# Patient Record
Sex: Female | Born: 1935 | Race: White | Hispanic: No | Marital: Married | Smoking: Never smoker
Health system: Southern US, Community
[De-identification: ages and names within clinical notes are randomized; demographics above are authoritative.]

## PROBLEM LIST (undated history)

## (undated) DIAGNOSIS — Z923 Personal history of irradiation: Secondary | ICD-10-CM

## (undated) DIAGNOSIS — I499 Cardiac arrhythmia, unspecified: Secondary | ICD-10-CM

## (undated) DIAGNOSIS — C4490 Unspecified malignant neoplasm of skin, unspecified: Secondary | ICD-10-CM

## (undated) DIAGNOSIS — I10 Essential (primary) hypertension: Secondary | ICD-10-CM

## (undated) DIAGNOSIS — Z9221 Personal history of antineoplastic chemotherapy: Secondary | ICD-10-CM

## (undated) DIAGNOSIS — C8580 Other specified types of non-Hodgkin lymphoma, unspecified site: Secondary | ICD-10-CM

## (undated) DIAGNOSIS — T7840XA Allergy, unspecified, initial encounter: Secondary | ICD-10-CM

## (undated) DIAGNOSIS — K219 Gastro-esophageal reflux disease without esophagitis: Secondary | ICD-10-CM

## (undated) DIAGNOSIS — E785 Hyperlipidemia, unspecified: Secondary | ICD-10-CM

## (undated) DIAGNOSIS — C50919 Malignant neoplasm of unspecified site of unspecified female breast: Secondary | ICD-10-CM

## (undated) DIAGNOSIS — I4891 Unspecified atrial fibrillation: Secondary | ICD-10-CM

## (undated) DIAGNOSIS — H409 Unspecified glaucoma: Secondary | ICD-10-CM

## (undated) DIAGNOSIS — N189 Chronic kidney disease, unspecified: Secondary | ICD-10-CM

## (undated) DIAGNOSIS — M1990 Unspecified osteoarthritis, unspecified site: Secondary | ICD-10-CM

## (undated) HISTORY — DX: Hyperlipidemia, unspecified: E78.5

## (undated) HISTORY — DX: Allergy, unspecified, initial encounter: T78.40XA

## (undated) HISTORY — DX: Unspecified osteoarthritis, unspecified site: M19.90

## (undated) HISTORY — DX: Other specified types of non-hodgkin lymphoma, unspecified site: C85.80

## (undated) HISTORY — DX: Unspecified malignant neoplasm of skin, unspecified: C44.90

## (undated) HISTORY — PX: BLADDER SUSPENSION: SHX72

## (undated) HISTORY — PX: OTHER SURGICAL HISTORY: SHX169

## (undated) HISTORY — PX: JOINT REPLACEMENT: SHX530

## (undated) HISTORY — PX: CHOLECYSTECTOMY: SHX55

## (undated) HISTORY — DX: Chronic kidney disease, unspecified: N18.9

## (undated) HISTORY — DX: Unspecified glaucoma: H40.9

## (undated) HISTORY — PX: ABDOMINAL HYSTERECTOMY: SHX81

## (undated) HISTORY — PX: BREAST SURGERY: SHX581

## (undated) HISTORY — PX: ROTATOR CUFF REPAIR: SHX139

## (undated) HISTORY — DX: Cardiac arrhythmia, unspecified: I49.9

## (undated) HISTORY — PX: KNEE SURGERY: SHX244

---

## 2004-11-24 DIAGNOSIS — C50919 Malignant neoplasm of unspecified site of unspecified female breast: Secondary | ICD-10-CM

## 2004-11-24 HISTORY — PX: MASTECTOMY: SHX3

## 2004-11-24 HISTORY — DX: Malignant neoplasm of unspecified site of unspecified female breast: C50.919

## 2006-09-08 ENCOUNTER — Ambulatory Visit: Payer: Self-pay | Admitting: Family Medicine

## 2008-01-25 ENCOUNTER — Ambulatory Visit: Payer: Self-pay | Admitting: Gastroenterology

## 2009-02-05 ENCOUNTER — Ambulatory Visit: Payer: Self-pay | Admitting: Family Medicine

## 2009-02-23 ENCOUNTER — Ambulatory Visit: Payer: Self-pay | Admitting: Family Medicine

## 2009-09-21 ENCOUNTER — Ambulatory Visit: Payer: Self-pay | Admitting: Family Medicine

## 2010-06-23 ENCOUNTER — Ambulatory Visit: Payer: Self-pay | Admitting: Internal Medicine

## 2011-07-24 ENCOUNTER — Encounter: Payer: Self-pay | Admitting: Family Medicine

## 2011-07-26 ENCOUNTER — Encounter: Payer: Self-pay | Admitting: Family Medicine

## 2011-08-25 ENCOUNTER — Encounter: Payer: Self-pay | Admitting: Family Medicine

## 2011-09-25 ENCOUNTER — Encounter: Payer: Self-pay | Admitting: Family Medicine

## 2011-10-25 ENCOUNTER — Encounter: Payer: Self-pay | Admitting: Family Medicine

## 2011-11-25 ENCOUNTER — Encounter: Payer: Self-pay | Admitting: Family Medicine

## 2013-07-02 ENCOUNTER — Observation Stay: Payer: Self-pay | Admitting: Surgery

## 2013-07-02 ENCOUNTER — Ambulatory Visit: Payer: Self-pay | Admitting: Family Medicine

## 2013-07-02 LAB — COMPREHENSIVE METABOLIC PANEL
Alkaline Phosphatase: 105 U/L (ref 50–136)
Anion Gap: 10 (ref 7–16)
BUN: 34 mg/dL — ABNORMAL HIGH (ref 7–18)
Bilirubin,Total: 0.5 mg/dL (ref 0.2–1.0)
Calcium, Total: 9.8 mg/dL (ref 8.5–10.1)
Co2: 26 mmol/L (ref 21–32)
Creatinine: 2.37 mg/dL — ABNORMAL HIGH (ref 0.60–1.30)
EGFR (African American): 22 — ABNORMAL LOW
EGFR (Non-African Amer.): 19 — ABNORMAL LOW
Osmolality: 276 (ref 275–301)
Potassium: 4.6 mmol/L (ref 3.5–5.1)
SGOT(AST): 30 U/L (ref 15–37)

## 2013-07-02 LAB — URINALYSIS, COMPLETE
Blood: NEGATIVE
Glucose,UR: NEGATIVE mg/dL (ref 0–75)
Granular Cast: 2
Leukocyte Esterase: NEGATIVE
Nitrite: NEGATIVE
Ph: 5 (ref 4.5–8.0)
Protein: 100
RBC,UR: 3 /HPF (ref 0–5)
Specific Gravity: 1.02 (ref 1.003–1.030)
WBC UR: 21 /HPF (ref 0–5)

## 2013-07-02 LAB — CBC WITH DIFFERENTIAL/PLATELET
Basophil #: 0.1 10*3/uL (ref 0.0–0.1)
Basophil %: 0.5 %
Eosinophil #: 0.1 10*3/uL (ref 0.0–0.7)
Eosinophil %: 0.7 %
HCT: 36.3 % (ref 35.0–47.0)
HGB: 11.8 g/dL — ABNORMAL LOW (ref 12.0–16.0)
Lymphocyte #: 3 10*3/uL (ref 1.0–3.6)
Lymphocyte %: 17.1 %
MCHC: 32.6 g/dL (ref 32.0–36.0)
MCV: 89 fL (ref 80–100)
Monocyte %: 9 %
Neutrophil #: 12.6 10*3/uL — ABNORMAL HIGH (ref 1.4–6.5)
Neutrophil %: 72.7 %
RBC: 4.06 10*6/uL (ref 3.80–5.20)
WBC: 17.3 10*3/uL — ABNORMAL HIGH (ref 3.6–11.0)

## 2013-07-02 LAB — LIPASE, BLOOD: Lipase: 115 U/L (ref 73–393)

## 2013-07-03 LAB — CBC WITH DIFFERENTIAL/PLATELET
Basophil %: 0.4 %
Eosinophil %: 1.4 %
HGB: 10.2 g/dL — ABNORMAL LOW (ref 12.0–16.0)
MCHC: 34.1 g/dL (ref 32.0–36.0)
Monocyte #: 1.2 x10 3/mm — ABNORMAL HIGH (ref 0.2–0.9)
Neutrophil #: 10.1 10*3/uL — ABNORMAL HIGH (ref 1.4–6.5)
Neutrophil %: 67.5 %
RBC: 3.34 10*6/uL — ABNORMAL LOW (ref 3.80–5.20)
WBC: 14.9 10*3/uL — ABNORMAL HIGH (ref 3.6–11.0)

## 2013-07-03 LAB — BASIC METABOLIC PANEL
Anion Gap: 4 — ABNORMAL LOW (ref 7–16)
BUN: 32 mg/dL — ABNORMAL HIGH (ref 7–18)
Calcium, Total: 8.9 mg/dL (ref 8.5–10.1)
Creatinine: 1.24 mg/dL (ref 0.60–1.30)
Glucose: 123 mg/dL — ABNORMAL HIGH (ref 65–99)
Osmolality: 275 (ref 275–301)
Potassium: 4.7 mmol/L (ref 3.5–5.1)

## 2013-07-04 LAB — BASIC METABOLIC PANEL
Anion Gap: 6 — ABNORMAL LOW (ref 7–16)
BUN: 17 mg/dL (ref 7–18)
Calcium, Total: 8.7 mg/dL (ref 8.5–10.1)
Chloride: 106 mmol/L (ref 98–107)
Co2: 24 mmol/L (ref 21–32)
Glucose: 125 mg/dL — ABNORMAL HIGH (ref 65–99)
Osmolality: 275 (ref 275–301)
Potassium: 5.1 mmol/L (ref 3.5–5.1)

## 2013-07-04 LAB — CBC WITH DIFFERENTIAL/PLATELET
Basophil %: 0.2 %
Eosinophil #: 0 10*3/uL (ref 0.0–0.7)
HGB: 9.4 g/dL — ABNORMAL LOW (ref 12.0–16.0)
Lymphocyte #: 2.4 10*3/uL (ref 1.0–3.6)
Lymphocyte %: 18.7 %
MCHC: 34.3 g/dL (ref 32.0–36.0)
Monocyte #: 1 x10 3/mm — ABNORMAL HIGH (ref 0.2–0.9)
Monocyte %: 7.5 %
Neutrophil #: 9.5 10*3/uL — ABNORMAL HIGH (ref 1.4–6.5)
RDW: 13.6 % (ref 11.5–14.5)

## 2013-07-11 LAB — PATHOLOGY REPORT

## 2013-09-27 ENCOUNTER — Ambulatory Visit: Payer: Self-pay

## 2013-10-19 ENCOUNTER — Ambulatory Visit: Payer: Self-pay | Admitting: Physician Assistant

## 2013-10-19 LAB — URINALYSIS, COMPLETE
Bacteria: NEGATIVE
Bilirubin,UR: NEGATIVE
Glucose,UR: NEGATIVE mg/dL (ref 0–75)
Nitrite: NEGATIVE
Ph: 7.5 (ref 4.5–8.0)
Protein: NEGATIVE
Specific Gravity: 1.005 (ref 1.003–1.030)

## 2013-10-21 LAB — URINE CULTURE

## 2013-12-13 ENCOUNTER — Ambulatory Visit: Payer: Self-pay | Admitting: Gastroenterology

## 2013-12-14 ENCOUNTER — Ambulatory Visit: Payer: Self-pay | Admitting: Gastroenterology

## 2013-12-22 ENCOUNTER — Ambulatory Visit: Payer: Self-pay | Admitting: Internal Medicine

## 2013-12-29 ENCOUNTER — Ambulatory Visit: Payer: Self-pay | Admitting: Internal Medicine

## 2014-06-12 ENCOUNTER — Ambulatory Visit: Payer: Self-pay

## 2014-06-12 LAB — URINALYSIS, COMPLETE
Bilirubin,UR: NEGATIVE
Glucose,UR: NEGATIVE mg/dL (ref 0–75)
Ketone: NEGATIVE
NITRITE: NEGATIVE
Ph: 6 (ref 4.5–8.0)
Protein: NEGATIVE
SPECIFIC GRAVITY: 1.015 (ref 1.003–1.030)

## 2014-06-14 LAB — URINE CULTURE

## 2014-12-26 ENCOUNTER — Ambulatory Visit: Payer: Self-pay | Admitting: Internal Medicine

## 2015-03-16 NOTE — Discharge Summary (Signed)
PATIENT NAME:  Sandy Hickman, Sandy Hickman MR#:  [id number] DATE OF BIRTH:  1934/08/05  DATE OF ADMISSION:  07/02/2013 DATE OF DISCHARGE:  07/05/2013  BRIEF HISTORY: Sandy Hickman is a 81 year old woman admitted through the Emergency Room with abdominal pain, nausea and vomiting. She had similar episodes in the past which have been treated as diverticulitis. She presented to the acute care center in [facility] on the morning of admission where laboratory values noted a creatinine of 2.37 and elevated white blood cell count 17,000. CT scan revealed multiple gallstones, probably a large impacted cystic duct stone and pericholecystic fluid. She was admitted to the hospital, placed on IV antibiotics and rehydration. Her creatinine came down the following day to 1.2. She continued to have significant abdominal pain. We thought she had acute cholecystitis. She was taken to surgery on the morning of 07/03/2013. She underwent a laparoscopic cholecystectomy. The patient had significant acute cholecystitis which improved over the next 48 hours. She was discharged home on the 13th to be followed in the office in 7 to 10 days' time. Bathing, activity and driving instructions were given to the patient. She was discharged home on aspirin 81 mg p.o. daily, calcium carbonate 600 mg once a day, citalopram 10 mg once a day, enalapril 5 mg b.i.d., gemfibrozil 600 mg once a day, Klor-Con 20 mg once a day, Ambien 10 mg once a day, omeprazole 20 mg once a day, anastrozole 1 mg once a day and Vicodin 5/325 every 4 hours p.r.n. pain.   FINAL DISCHARGE DIAGNOSIS:  Acute cholecystitis.  SURGERY: Laparoscopic cholecystectomy. ____________________________ Rodena Goldmann III, MD rle:sb D: 07/11/2013 22:35:16 ET T: 07/12/2013 08:52:31 ET JOB#: [id number]  cc: Micheline Maze, MD, <Dictator> Valetta Close, MD Rodena Goldmann MD ELECTRONICALLY SIGNED 07/12/2013 19:26

## 2015-03-16 NOTE — Op Note (Signed)
PATIENT NAME:  Sandy Hickman, Sandy Hickman MR#:  [id number] DATE OF BIRTH:  1934/09/01  DATE OF PROCEDURE:  07/03/2013  PREOPERATIVE DIAGNOSIS: Acute cholecystitis.   POSTOPERATIVE DIAGNOSIS: Acute cholecystitis.   OPERATION: Laparoscopic cholecystectomy.   SURGEON: Micheline Maze, M.D.   ANESTHESIA: General.   OPERATIVE PROCEDURE: With the patient in the supine position after induction of appropriate general anesthesia, the patient's abdomen was prepped with ChloraPrep and draped with sterile towels. The patient was placed in the head down, feet up position. A small infraumbilical incision was made in the standard fashion and carried down bluntly through the subcutaneous tissue. The Veress needle was used to cannulate the peritoneal cavity. CO2 was insufflated to appropriate pressure measurements. When approximately 2.5 liters of CO2 was instilled, the Veress needle was withdrawn. An 11 mm [employer] port was inserted into the peritoneal cavity. Intraperitoneal position was confirmed. CO2 was reinsufflated. The patient was placed in the head up, feet down position and rotated slightly to the left side. A subxiphoid transverse incision was made, and an 11 mm port was inserted under direct vision. Two lateral ports 5 mm in size were inserted under direct vision. The gallbladder was significantly distended and erythematous with obvious changes of edema and acute cholecystitis. The gallbladder was drained of approximately 50 mL of purulent-appearing bile. There was almost no bile staining in the material. The gallbladder was grasped, retracted superiorly and laterally, exposing the hepatoduodenal ligament. There appeared to be a very large, several centimeter ductal stone at the cystic and common duct junction. The gallbladder was dissected around this area, exposing the cystic artery and cystic duct. The gallstone was moved back into the liver with manipulating it through the grasper and the cystic duct doubly  clipped and divided. The cystic artery was doubly clipped and divided. The gallbladder was then dissected free from its bed to the liver using cautery apparatus. The gallbladder was captured in an Endo Catch apparatus and removed through the umbilicus. The umbilical incision had to be enlarged to accommodate the very large gallbladder. There had been significant bleeding in the bed of the liver because of the acute inflammatory changes. The bed was re-examined and several small bleeding points coagulated. The area was irrigated with warm saline solution. A 19-French Blake drain was placed through one of the other ports and brought out through the lower lateral port with the drain being placed in the bed of the liver. It was secured with 3-0 nylon. The upper abdominal incision was closed under direct vision using the suture passer and 0 Vicryl. The midline fascia was closed with interrupted figure-of-eight sutures of 0 Vicryl. Skin was closed with 5-0 nylon. The area was infiltrated with 0.25% Marcaine for postoperative pain control. Sterile dressings were applied. The patient was returned to the recovery room, having tolerated the procedure well. Sponge, instrument and needle counts were correct x2 in the operating room.   ____________________________ Rodena Goldmann III, MD rle:gb D: 07/03/2013 13:15:40 ET T: 07/03/2013 21:28:04 ET JOB#: [id number]  cc: Rodena Goldmann III, MD, <Dictator> Valetta Close, MD Rodena Goldmann MD ELECTRONICALLY SIGNED 07/11/2013 22:33

## 2015-03-16 NOTE — H&P (Signed)
PATIENT NAME:  Sandy Hickman, Sandy Hickman MR#:  [id number] DATE OF BIRTH:  [birth year]  DATE OF ADMISSION:  07/02/2013  PRIMARY CARE PHYSICIAN:  Dr. Bernie Covey  ADMITTING PHYSICIAN:  Dr. Pat Patrick  CHIEF COMPLAINT:  Abdominal pain, nausea and vomiting.   BRIEF HISTORY: Avalee Castrellon is a 81 year old woman with a several-day history of abdominal pain, primarily midepigastric, midabdominal and right upper quadrant pain. She has had several episodes of diverticulitis treated with antibiotics. She felt the symptoms were very similar and likely related to diverticular disease. She has had a colonoscopy demonstrating diverticulosis. She went to the primary care physician she normally uses in the middle of the week, was placed on antibiotics, but did not begin to improve on antibiotics. She continued to have nausea and vomiting, and increasing abdominal pain. The pain was worse this morning, so she presented to the [place] [facility], where laboratory values revealed some markedly abnormal results. She had a white blood cell count of 17,000, creatinine of 2.37, normal liver function studies. She was referred to the hospital for further evaluation. CT scan was performed in the Emergency Room, which demonstrated multiple gallstones, a probable  large impacted cystic duct stone, and pericholecystic fluid consistent with acute cholecystitis. She does not have any biliary tract history. She denies history of hepatitis, yellow jaundice, pancreatitis, peptic ulcer disease, or previous diagnosis of gallbladder disease. The only previous abdominal surgery has been a vaginal hysterectomy. She has had a partial mastectomy, radiation and chemotherapy for breast cancer in 2006. That series of treatments was undertaken at [facility]. She has history of cardiac irregularity, currently not on any particular treatment. She has history of hypertension. She has no history of myocardial infarction, cardiac catheterization, diabetes  or thyroid disease. She is regularly followed by Dr. Clemmie Krill.   CURRENT MEDICATIONS:  Include Augmentin 875 b.i.d., Arimidex 1 mg once a day, aspirin 81 mg once a day, citalopram 10 mg once a day, enalapril 5 mg b.i.d., gemfibrozil 600 mg once a day, potassium 20 mg once a day, omeprazole 20 mg once a day, and Ambien 10 mg at bedtime p.r.n.  ALLERGIES:  She is allergic to NEOSPORIN and SULFA DRUGS, both of which cause dermatitis and itching.   SOCIAL HISTORY:  She is not a cigarette smoker. She does not drink alcohol regularly. She lives with her husband.   REVIEW OF SYSTEMS:  Unremarkable.   PHYSICAL EXAMINATION: GENERAL: She is an alert, pleasant woman in moderate distress from pain. She is feeling better since presenting to the Emergency Room.  VITAL SIGNS:  Her blood pressure is 111/45, pulse 76 and regular. Pain scale is now 4.  HEENT:  Exam reveals no scleral icterus, no pupillary abnormalities, no facial deformities.  NECK:  Supple, nontender, with no adenopathy and a normal midline trachea.  CHEST:  Clear, with no adventitious sounds. She has normal pulmonary excursion.  CARDIAC:  Exam reveals no murmurs or gallops. She seems to be in a normal sinus rhythm. I do not hear any dysrhythmia on examination.  ABDOMEN:  Exam reveals some mild right upper quadrant and midepigastric tenderness. I cannot palpate her gallbladder. She has hypoactive but present bowel sounds. CT scan shows she has  a right inguinal hernia, but I cannot demonstrate it on exam.  EXTREMITIES:  Lower extremity exam reveals full range of motion, no deformities. Good distal pulses.  PSYCHIATRIC:  Exam reveals normal orientation, normal affect.   DATA:  I have independently reviewed her CT scan.  She does appear to have multiple, very large gallstones with gallbladder wall thickening and a possible impacted cystic duct stone. The scan is noncontrasted, so I do not see any other significant abnormalities.   PLAN:  Will plan  on admitting her to the hospital. We will rehydrate her, start her on antibiotics. I think surgery is the appropriate therapy in this situation, but because of her creatinine, we will rehydrate her and see how she responds to that therapy before deciding on the timing of surgery.  This plan has been discussed with the patient in detail, and she is in agreement. Her family was present for the interview.     ____________________________ Rodena Goldmann III, MD rle:mr D: 07/02/2013 17:26:26 ET T: 07/02/2013 19:03:33 ET JOB#: [id number]  cc: Micheline Maze, MD, <Dictator> Valetta Close, MD  Rodena Goldmann MD ELECTRONICALLY SIGNED 07/03/2013 14:01

## 2015-03-18 ENCOUNTER — Ambulatory Visit
Admit: 2015-03-18 | Disposition: A | Discharge status: Home / Self Care | Payer: Self-pay | Attending: Family Medicine | Admitting: Family Medicine

## 2015-08-02 ENCOUNTER — Other Ambulatory Visit: Payer: Self-pay | Admitting: Internal Medicine

## 2015-08-02 DIAGNOSIS — R1032 Left lower quadrant pain: Principal | ICD-10-CM

## 2015-08-02 DIAGNOSIS — D72829 Elevated white blood cell count, unspecified: Secondary | ICD-10-CM

## 2015-08-02 DIAGNOSIS — R1031 Right lower quadrant pain: Secondary | ICD-10-CM

## 2015-08-03 ENCOUNTER — Ambulatory Visit: Payer: Self-pay

## 2015-08-06 ENCOUNTER — Ambulatory Visit: Payer: PPO

## 2015-08-06 ENCOUNTER — Ambulatory Visit: Payer: Self-pay

## 2015-08-10 ENCOUNTER — Ambulatory Visit
Admission: RE | Admit: 2015-08-10 | Discharge: 2015-08-10 | Disposition: A | Discharge status: Home / Self Care | Payer: PPO | Source: Ambulatory Visit | Attending: Internal Medicine | Admitting: Internal Medicine

## 2015-08-10 DIAGNOSIS — R1031 Right lower quadrant pain: Secondary | ICD-10-CM | POA: Insufficient documentation

## 2015-08-10 DIAGNOSIS — K760 Fatty (change of) liver, not elsewhere classified: Secondary | ICD-10-CM | POA: Diagnosis not present

## 2015-08-10 DIAGNOSIS — K5730 Diverticulosis of large intestine without perforation or abscess without bleeding: Secondary | ICD-10-CM | POA: Diagnosis not present

## 2015-08-10 DIAGNOSIS — R1032 Left lower quadrant pain: Secondary | ICD-10-CM | POA: Diagnosis present

## 2015-08-10 DIAGNOSIS — D72829 Elevated white blood cell count, unspecified: Secondary | ICD-10-CM

## 2015-08-10 MED ORDER — IOHEXOL 300 MG/ML  SOLN
125.0000 mL | Freq: Once | INTRAMUSCULAR | Status: AC | PRN
  Administered 2015-08-10: 125 mL via INTRAVENOUS

## 2015-09-20 ENCOUNTER — Other Ambulatory Visit: Payer: Self-pay | Admitting: Internal Medicine

## 2015-09-20 DIAGNOSIS — Z1231 Encounter for screening mammogram for malignant neoplasm of breast: Secondary | ICD-10-CM

## 2015-09-20 DIAGNOSIS — C50911 Malignant neoplasm of unspecified site of right female breast: Secondary | ICD-10-CM

## 2015-09-30 ENCOUNTER — Ambulatory Visit
Admission: EM | Admit: 2015-09-30 | Discharge: 2015-09-30 | Disposition: A | Discharge status: Home / Self Care | Payer: PPO | Attending: Internal Medicine | Admitting: Internal Medicine

## 2015-09-30 ENCOUNTER — Encounter: Payer: Self-pay | Admitting: *Deleted

## 2015-09-30 DIAGNOSIS — N390 Urinary tract infection, site not specified: Secondary | ICD-10-CM

## 2015-09-30 DIAGNOSIS — R319 Hematuria, unspecified: Secondary | ICD-10-CM

## 2015-09-30 HISTORY — DX: Essential (primary) hypertension: I10

## 2015-09-30 HISTORY — DX: Gastro-esophageal reflux disease without esophagitis: K21.9

## 2015-09-30 LAB — URINALYSIS COMPLETE WITH MICROSCOPIC (ARMC ONLY)

## 2015-09-30 MED ORDER — CIPROFLOXACIN HCL 250 MG PO TABS: 250.0000 mg | ORAL_TABLET | Freq: Two times a day (BID) | ORAL | Status: AC

## 2015-09-30 MED ORDER — NITROFURANTOIN MONOHYD MACRO 100 MG PO CAPS: 100.0000 mg | ORAL_CAPSULE | Freq: Two times a day (BID) | ORAL | Status: DC

## 2015-09-30 NOTE — ED Provider Notes (Signed)
CSN: [id number]     Arrival date & time 09/30/15  1325 History   First MD Initiated Contact with Patient 09/30/15 1519     Chief Complaint  Patient presents with  . Urinary Tract Infection   HPI Patient is a 81 year old lady with past medical history notable for acid reflux and hypertension. She presents today with onset of suprapubic discomfort and dysuria yesterday, discomfort radiates across the low back. She started with achiness all over a couple of days prior to that. No fever, no nausea/vomiting. No diarrhea, no bowel change. No unusual vaginal bleeding or discharge. She had a urinary tract infection several months ago, that responded promptly to [employer].  Past Medical History  Diagnosis Date  . Cancer (HCC)     breast  . Hypertension   . GERD (gastroesophageal reflux disease)    Past Surgical History  Procedure Laterality Date  . Cholecystectomy    . Abdominal hysterectomy    . Breast surgery    . Joint replacement     History reviewed. No pertinent family history. Social History  Substance Use Topics  . Smoking status: Never Smoker   . Smokeless tobacco: Never Used  . Alcohol Use: No    Review of Systems  All other systems reviewed and are negative.   Allergies  Neosporin and Sulfa antibiotics  Home Medications   Prior to Admission medications   Medication Sig Start Date End Date Taking? Authorizing Provider  anastrozole (ARIMIDEX) 1 MG tablet Take 1 mg by mouth daily.   Yes Historical Provider, MD  aspirin 81 MG tablet Take 81 mg by mouth daily.   Yes Historical Provider, MD  citalopram (CELEXA) 10 MG tablet Take 10 mg by mouth daily.   Yes Historical Provider, MD  gemfibrozil (LOPID) 600 MG tablet Take 600 mg by mouth daily.   Yes Historical Provider, MD  latanoprost (XALATAN) 0.005 % ophthalmic solution Place 1 drop into both eyes at bedtime.   Yes Historical Provider, MD  losartan (COZAAR) 50 MG tablet Take 50 mg by mouth daily.   Yes Historical Provider,  MD  Magnesium 500 MG CAPS Take 1 each by mouth daily.   Yes Historical Provider, MD  Multiple Vitamins-Minerals (CENTRUM SILVER PO) Take 1 each by mouth daily.   Yes Historical Provider, MD  Omega-3 Fatty Acids (FISH OIL) 1200 MG CPDR Take 1 each by mouth daily.   Yes Historical Provider, MD  omeprazole (PRILOSEC) 20 MG capsule Take 20 mg by mouth daily.   Yes Historical Provider, MD  polycarbophil (FIBERCON) 625 MG tablet Take 1,250 mg by mouth daily.   Yes Historical Provider, MD  Timolol Maleate PF 0.25 % SOLN Apply 1 drop to eye daily.   Yes Historical Provider, MD    BP 165/83 mmHg  Pulse 73  Temp(Src) 98 F (36.7 C) (Oral)  Resp 20  Ht 5' 6.5" (1.689 m)  Wt 150 lb (68.04 kg)  BMI 23.85 kg/m2  SpO2 98%   Physical Exam  Constitutional: She is oriented to person, place, and time. No distress.  Alert, nicely groomed  HENT:  Head: Atraumatic.  Eyes:  Conjugate gaze, no eye redness/drainage  Neck: Neck supple.  Cardiovascular: Normal rate.   Pulmonary/Chest: No respiratory distress.  Abdominal: She exhibits no distension.  Musculoskeletal: Normal range of motion.  No leg swelling  Neurological: She is alert and oriented to person, place, and time.  Skin: Skin is warm and dry.  Pink. No cyanosis  Nursing note and vitals  reviewed.   ED Course  Procedures (including critical care time)  Labs Review Labs Reviewed  URINALYSIS COMPLETEWITH MICROSCOPIC ([facility] ONLY) - Abnormal; Notable for the following:    Color, Urine ORANGE (*)    APPearance CLOUDY (*)    Glucose, UA   (*)    Value: TEST NOT REPORTED DUE TO COLOR INTERFERENCE OF URINE PIGMENT   Bilirubin Urine   (*)    Value: TEST NOT REPORTED DUE TO COLOR INTERFERENCE OF URINE PIGMENT   Ketones, ur   (*)    Value: TEST NOT REPORTED DUE TO COLOR INTERFERENCE OF URINE PIGMENT   Hgb urine dipstick   (*)    Value: TEST NOT REPORTED DUE TO COLOR INTERFERENCE OF URINE PIGMENT   Protein, ur   (*)    Value: TEST NOT  REPORTED DUE TO COLOR INTERFERENCE OF URINE PIGMENT   Nitrite   (*)    Value: TEST NOT REPORTED DUE TO COLOR INTERFERENCE OF URINE PIGMENT   Leukocytes, UA   (*)    Value: TEST NOT REPORTED DUE TO COLOR INTERFERENCE OF URINE PIGMENT   Bacteria, UA MANY (*)    Squamous Epithelial / LPF 0-5 (*)    All other components within normal limits  URINE CULTURE    Urine culture pending.    MDM   1. Urinary tract infection with hematuria, site unspecified    Discharge Medication List as of 09/30/2015  3:31 PM    START taking these medications   Details  nitrofurantoin, macrocrystal-monohydrate, (MACROBID) 100 MG capsule Take 1 capsule (100 mg total) by mouth 2 (two) times daily., Starting 09/30/2015, Until Discontinued, Normal       Telephone call from pharmacy after pt discharge:  Insurance does not cover macrobid. Cipro 250mg  bid x 5d #10, no refill, substituted.    Sherlene Shams, MD 09/30/15 [phone]

## 2015-09-30 NOTE — Discharge Instructions (Signed)
Symptoms and urinalysis today suggest a urinary tract infection is causing your symptoms. A prescription for macrobid (nitrofurantoin, antibiotic) was sent to the [facility] in [place]. A urine culture is pending; result should be available in the next 2-3 days. Recheck or followup with pcp/Dr Raechel Ache if not starting to improve in a couple days.

## 2015-09-30 NOTE — ED Notes (Signed)
Patient started having abdominal pain and back pain with urinary burning yesterday. Patient reports having UTI in the past but not frequently.

## 2015-10-02 LAB — URINE CULTURE: Culture: >=100000

## 2015-10-02 NOTE — ED Notes (Signed)
Final report of urine C&S shows klebsiella, sensitive to Rx provided day of visit

## 2016-01-02 ENCOUNTER — Ambulatory Visit
Admission: RE | Admit: 2016-01-02 | Discharge: 2016-01-02 | Disposition: A | Discharge status: Home / Self Care | Payer: PPO | Source: Ambulatory Visit | Attending: Internal Medicine | Admitting: Internal Medicine

## 2016-01-02 DIAGNOSIS — C50911 Malignant neoplasm of unspecified site of right female breast: Secondary | ICD-10-CM

## 2016-01-02 DIAGNOSIS — Z853 Personal history of malignant neoplasm of breast: Secondary | ICD-10-CM | POA: Diagnosis present

## 2016-01-02 DIAGNOSIS — Z1231 Encounter for screening mammogram for malignant neoplasm of breast: Secondary | ICD-10-CM | POA: Diagnosis present

## 2016-01-02 HISTORY — DX: Malignant neoplasm of unspecified site of unspecified female breast: C50.919

## 2016-10-30 ENCOUNTER — Ambulatory Visit
Admission: EM | Admit: 2016-10-30 | Discharge: 2016-10-30 | Disposition: A | Discharge status: Home / Self Care | Payer: PPO | Attending: Family Medicine | Admitting: Family Medicine

## 2016-10-30 DIAGNOSIS — R300 Dysuria: Secondary | ICD-10-CM

## 2016-10-30 DIAGNOSIS — N390 Urinary tract infection, site not specified: Secondary | ICD-10-CM

## 2016-10-30 LAB — URINALYSIS, COMPLETE (UACMP) WITH MICROSCOPIC

## 2016-10-30 MED ORDER — ONDANSETRON HCL 4 MG PO TABS: 4.0000 mg | ORAL_TABLET | Freq: Four times a day (QID) | ORAL | 0 refills | Status: DC

## 2016-10-30 MED ORDER — CEFUROXIME AXETIL 250 MG PO TABS
250.0000 mg | ORAL_TABLET | Freq: Two times a day (BID) | ORAL | 0 refills | Status: DC
Start: 2016-10-30 — End: 2017-01-18

## 2016-10-30 NOTE — ED Provider Notes (Signed)
CSN: [id number]     Arrival date & time 10/30/16  1137 History   None    Chief Complaint  Patient presents with  . Urinary Tract Infection   (Consider location/radiation/quality/duration/timing/severity/associated sxs/prior Treatment) Patient c/o dysuria and bladder discomfort this am.  She states she has a lot urinary frequency.  She was nauseated and weak last night.  She c/o discomfort in her bladder radiating to her thighs bilateral.  She is seen by Dr. Raechel Ache who is her PCP.  She denies nausea at present. Patient has started taking AZO which is helping some she states.   The history is provided by the patient.  Urinary Tract Infection  Pain quality:  Aching Pain severity:  Moderate Onset quality:  Sudden Duration:  1 day Timing:  Constant Progression:  Unchanged Chronicity:  New Recent urinary tract infections: no   Relieved by:  Phenazopyridine (AZO) Worsened by:  Nothing Urinary symptoms: frequent urination   Associated symptoms: abdominal pain and nausea     Past Medical History:  Diagnosis Date  . Breast cancer ([place]) 2006   rt mastectomy  . Cancer (HCC)    breast  . GERD (gastroesophageal reflux disease)   . Hypertension    Past Surgical History:  Procedure Laterality Date  . ABDOMINAL HYSTERECTOMY    . BREAST SURGERY    . CHOLECYSTECTOMY    . JOINT REPLACEMENT    . MASTECTOMY Right 2006   rt mast /rad/chemo   Family History  Problem Relation Age of Onset  . Breast cancer Daughter 64   Social History  Substance Use Topics  . Smoking status: Never Smoker  . Smokeless tobacco: Never Used  . Alcohol use No   OB History    No data available     Review of Systems  Constitutional: Positive for fatigue.  Eyes: Negative.   Respiratory: Negative.   Cardiovascular: Negative.   Gastrointestinal: Positive for abdominal pain and nausea.  Endocrine: Negative.   Genitourinary: Positive for dysuria.  Musculoskeletal: Negative.   Skin: Negative.    Allergic/Immunologic: Negative.   Neurological: Negative.   Hematological: Negative.   Psychiatric/Behavioral: Negative.     Allergies  Neosporin [neomycin-bacitracin zn-polymyx] and Sulfa antibiotics  Home Medications   Prior to Admission medications   Medication Sig Start Date End Date Taking? Authorizing Provider  anastrozole (ARIMIDEX) 1 MG tablet Take 1 mg by mouth daily.   Yes Historical Provider, MD  aspirin 81 MG tablet Take 81 mg by mouth daily.   Yes Historical Provider, MD  citalopram (CELEXA) 10 MG tablet Take 10 mg by mouth daily.   Yes Historical Provider, MD  gemfibrozil (LOPID) 600 MG tablet Take 600 mg by mouth daily.   Yes Historical Provider, MD  latanoprost (XALATAN) 0.005 % ophthalmic solution Place 1 drop into both eyes at bedtime.   Yes Historical Provider, MD  losartan (COZAAR) 50 MG tablet Take 100 mg by mouth daily.    Yes Historical Provider, MD  Magnesium 500 MG CAPS Take 1 each by mouth daily.   Yes Historical Provider, MD  Multiple Vitamins-Minerals (CENTRUM SILVER PO) Take 1 each by mouth daily.   Yes Historical Provider, MD  Omega-3 Fatty Acids (FISH OIL) 1200 MG CPDR Take 1 each by mouth daily.   Yes Historical Provider, MD  omeprazole (PRILOSEC) 20 MG capsule Take 20 mg by mouth daily.   Yes Historical Provider, MD  polycarbophil (FIBERCON) 625 MG tablet Take 1,250 mg by mouth daily.   Yes Historical Provider,  MD  Timolol Maleate PF 0.25 % SOLN Apply 1 drop to eye daily.   Yes Historical Provider, MD  cefUROXime (CEFTIN) 250 MG tablet Take 1 tablet (250 mg total) by mouth 2 (two) times daily with a meal. 10/30/16   Lysbeth Penner, FNP   Meds Ordered and Administered this Visit  Medications - No data to display  BP (!) 171/92 (BP Location: Left Arm)   Pulse 95   Temp 98.2 F (36.8 C) (Oral)   Resp 18   Ht 5\' 6"  (1.676 m)   Wt 150 lb (68 kg)   SpO2 97%   BMI 24.21 kg/m  No data found.   Physical Exam  Constitutional: She is oriented to  person, place, and time. She appears well-developed and well-nourished.  HENT:  Head: Normocephalic and atraumatic.  Eyes: EOM are normal. Pupils are equal, round, and reactive to light.  Cardiovascular: Normal rate, regular rhythm and normal heart sounds.   Pulmonary/Chest: Effort normal and breath sounds normal.  Abdominal: There is tenderness.  TTP bladder  No CVA tenderness.  Genitourinary:  Genitourinary Comments: No CVA tenderness  Neurological: She is alert and oriented to person, place, and time.  Nursing note and vitals reviewed.   Urgent Care Course   Clinical Course     Procedures (including critical care time)  Labs Review Labs Reviewed  URINALYSIS, COMPLETE (UACMP) WITH MICROSCOPIC - Abnormal; Notable for the following:       Result Value   Color, Urine ORANGE (*)    APPearance CLOUDY (*)    Glucose, UA   (*)    Value: TEST NOT REPORTED DUE TO COLOR INTERFERENCE OF URINE PIGMENT   Hgb urine dipstick   (*)    Value: TEST NOT REPORTED DUE TO COLOR INTERFERENCE OF URINE PIGMENT   Bilirubin Urine   (*)    Value: TEST NOT REPORTED DUE TO COLOR INTERFERENCE OF URINE PIGMENT   Ketones, ur   (*)    Value: TEST NOT REPORTED DUE TO COLOR INTERFERENCE OF URINE PIGMENT   Protein, ur   (*)    Value: TEST NOT REPORTED DUE TO COLOR INTERFERENCE OF URINE PIGMENT   Nitrite   (*)    Value: TEST NOT REPORTED DUE TO COLOR INTERFERENCE OF URINE PIGMENT   Leukocytes, UA   (*)    Value: TEST NOT REPORTED DUE TO COLOR INTERFERENCE OF URINE PIGMENT   Squamous Epithelial / LPF 0-5 (*)    Bacteria, UA MANY (*)    All other components within normal limits  URINE CULTURE    Imaging Review No results found.   Visual Acuity Review  Right Eye Distance:   Left Eye Distance:   Bilateral Distance:    Right Eye Near:   Left Eye Near:    Bilateral Near:         MDM   1. Lower urinary tract infectious disease   2. Dysuria   3. Nausea  Continue AZO Ceftin 250mg  one po  bid x 10 days #20 Zofran 4mg  one po tid prn #12 UA cx  Recommend she follow up with Dr. Raechel Ache and can get reflex UA at that time.   Push po fluids, rest, tylenol and motrin otc prn as directed for fever, arthralgias, and myalgias.  Follow up prn if sx's continue or persist.    Lysbeth Penner, FNP 10/30/16 1243

## 2016-10-30 NOTE — ED Triage Notes (Signed)
Pt c/o burning and pain with urination.

## 2016-11-01 LAB — URINE CULTURE

## 2016-12-05 ENCOUNTER — Other Ambulatory Visit
Admission: RE | Admit: 2016-12-05 | Discharge: 2016-12-05 | Disposition: A | Discharge status: Home / Self Care | Payer: Medicare HMO | Source: Ambulatory Visit | Attending: Urology | Admitting: Urology

## 2016-12-05 ENCOUNTER — Encounter: Payer: Self-pay | Admitting: Urology

## 2016-12-05 ENCOUNTER — Other Ambulatory Visit: Payer: Self-pay | Admitting: *Deleted

## 2016-12-05 ENCOUNTER — Ambulatory Visit (INDEPENDENT_AMBULATORY_CARE_PROVIDER_SITE_OTHER): Payer: Medicare HMO | Admitting: Urology

## 2016-12-05 VITALS — BP 140/80 | HR 84 | Ht 65.0 in | Wt 154.0 lb

## 2016-12-05 DIAGNOSIS — N952 Postmenopausal atrophic vaginitis: Secondary | ICD-10-CM | POA: Diagnosis not present

## 2016-12-05 DIAGNOSIS — N390 Urinary tract infection, site not specified: Secondary | ICD-10-CM

## 2016-12-05 LAB — URINALYSIS, COMPLETE (UACMP) WITH MICROSCOPIC
Glucose, UA: NEGATIVE mg/dL
HGB URINE DIPSTICK: NEGATIVE
Nitrite: NEGATIVE
PROTEIN: 30 mg/dL — AB
SPECIFIC GRAVITY, URINE: 1.02 (ref 1.005–1.030)
pH: 5.5 (ref 5.0–8.0)

## 2016-12-05 LAB — BLADDER SCAN AMB NON-IMAGING: Scan Result: 0

## 2016-12-05 NOTE — Progress Notes (Signed)
12/05/2016 11:48 AM   Cimone Stefanie Libel 1934/08/21 [id number]  Referring provider: Ezequiel Kayser, MD [place] [facility] [place], [place] [address]  Chief Complaint  Patient presents with  . New Patient (Initial Visit)    recurrent uti referred by Dr. Dorthula Perfect    HPI: Patient is a 83 -year-old Caucasian female who is referred to [place] by, [facility], for recurrent urinary tract infections.  Patient states that she has had three urinary tract infections over the last few months.  Her symptoms with a urinary tract infection consist of pelvic pressure, urgency and frequency and dysuria.  .  She denies back pain, abdominal pain or flank pain.  She has not had any recent fevers, chills, nausea or vomiting.  She does have a history of nephrolithiasis and GU surgery.    Reviewing her records,  she has had three documented urinary tract infections.  Two for pan-sensitive E.coli and one for Klebsiella.     She is not sexually active.  She is post menopausal.  She admits to diarrhea on occasions.   She does engage in good perineal hygiene. She does not take tub baths.   She does not have incontinence.    She is not having pain with bladder filling.    She underwent contrast CT in 07/2015 and kidneys and bladder were unremarkable.  I have independently reviewed the films.    She is not drinking a lot of water daily.  She does admit to drinking sweet tea and coffee.  She does drink cranberry juice.    She is not experiencing symptoms of an urinary tract infection at this time.  UA most likely contaminated.  PVR is 0 mL.    PMH: Past Medical History:  Diagnosis Date  . Arrhythmia   . Breast cancer ([place]) 2006   rt mastectomy  . Cancer (HCC)    breast  . GERD (gastroesophageal reflux disease)   . Glaucoma   . HLD (hyperlipidemia)   . Hypertension     Surgical History: Past Surgical History:  Procedure Laterality Date  . ABDOMINAL HYSTERECTOMY    . BLADDER SUSPENSION      15 years ago  . BREAST SURGERY    . CHOLECYSTECTOMY    . JOINT REPLACEMENT    . KNEE SURGERY Right    2016  . MASTECTOMY Right 2006   rt mast /rad/chemo  . ROTATOR CUFF REPAIR     13 years    Home Medications:  Allergies as of 12/05/2016      Reactions   Neosporin [neomycin-bacitracin Zn-polymyx] Dermatitis   Sulfa Antibiotics Itching, Rash   Enalapril Other (See Comments)   Oxycodone-acetaminophen Itching      Medication List       Accurate as of 12/05/16 11:48 AM. Always use your most recent med list.          anastrozole 1 MG tablet Commonly known as:  ARIMIDEX Take 1 mg by mouth daily.   aspirin 81 MG tablet Take 81 mg by mouth daily.   azelastine 0.1 % nasal spray Commonly known as:  ASTELIN Place into the nose.   cefUROXime 250 MG tablet Commonly known as:  CEFTIN Take 1 tablet (250 mg total) by mouth 2 (two) times daily with a meal.   CENTRUM SILVER PO Take 1 each by mouth daily.   citalopram 10 MG tablet Commonly known as:  CELEXA Take 10 mg by mouth daily.   clobetasol cream 0.05 %  Commonly known as:  TEMOVATE Apply topically.   cyanocobalamin 500 MCG tablet Take by mouth.   ezetimibe 10 MG tablet Commonly known as:  ZETIA Take by mouth.   Fish Oil 1200 MG Cpdr Take 1 each by mouth daily.   FISH OIL PO Take by mouth.   gabapentin 300 MG capsule Commonly known as:  NEURONTIN Take by mouth.   gemfibrozil 600 MG tablet Commonly known as:  LOPID Take 600 mg by mouth daily.   HYDROcodone-acetaminophen 5-325 MG tablet Commonly known as:  NORCO/VICODIN Take by mouth.   latanoprost 0.005 % ophthalmic solution Commonly known as:  XALATAN Place 1 drop into both eyes at bedtime.   loratadine 10 MG tablet Commonly known as:  CLARITIN Take by mouth.   losartan 50 MG tablet Commonly known as:  COZAAR Take 100 mg by mouth daily.   Magnesium 500 MG Caps Take 1 each by mouth daily.   magnesium gluconate 500 MG tablet Commonly  known as:  MAGONATE Take by mouth.   Melatonin 10 MG Tabs Take by mouth.   mirtazapine 15 MG tablet Commonly known as:  REMERON Take by mouth.   omeprazole 20 MG capsule Commonly known as:  PRILOSEC Take 20 mg by mouth daily.   ondansetron 4 MG tablet Commonly known as:  ZOFRAN Take 1 tablet (4 mg total) by mouth every 6 (six) hours.   ondansetron 4 MG tablet Commonly known as:  ZOFRAN Take 1 tablet (4 mg total) by mouth every 6 (six) hours.   ondansetron 4 MG tablet Commonly known as:  ZOFRAN Take 1 tablet (4 mg total) by mouth every 6 (six) hours.   polycarbophil 625 MG tablet Commonly known as:  FIBERCON Take 1,250 mg by mouth daily.   PROBIOTIC ACIDOPHILUS PO Take by mouth.   Timolol Maleate PF 0.25 % Soln Apply 1 drop to eye daily.       Allergies:  Allergies  Allergen Reactions  . Neosporin [Neomycin-Bacitracin Zn-Polymyx] Dermatitis  . Sulfa Antibiotics Itching and Rash  . Enalapril Other (See Comments)  . Oxycodone-Acetaminophen Itching    Family History: Family History  Problem Relation Age of Onset  . Breast cancer Daughter 1  . Prostate cancer Neg Hx   . Bladder Cancer Neg Hx   . Kidney cancer Neg Hx     Social History:  reports that she has never smoked. She has never used smokeless tobacco. She reports that she drinks alcohol. She reports that she does not use drugs.  ROS: UROLOGY Frequent Urination?: Yes Hard to postpone urination?: Yes Burning/pain with urination?: Yes Get up at night to urinate?: Yes Leakage of urine?: No Urine stream starts and stops?: Yes Trouble starting stream?: No Do you have to strain to urinate?: No Blood in urine?: No Urinary tract infection?: Yes Sexually transmitted disease?: No Injury to kidneys or bladder?: No Painful intercourse?: No Weak stream?: No Currently pregnant?: No Vaginal bleeding?: No Last menstrual period?: n  Gastrointestinal Nausea?: No Vomiting?: No Indigestion/heartburn?:  No Diarrhea?: No Constipation?: No  Constitutional Fever: No Night sweats?: No Weight loss?: No Fatigue?: No  Skin Skin rash/lesions?: No Itching?: Yes  Eyes Blurred vision?: No Double vision?: No  Ears/Nose/Throat Sore throat?: No Sinus problems?: Yes  Hematologic/Lymphatic Swollen glands?: No Easy bruising?: Yes  Cardiovascular Leg swelling?: No Chest pain?: No  Respiratory Cough?: No Shortness of breath?: No  Endocrine Excessive thirst?: No  Musculoskeletal Back pain?: No Joint pain?: No  Neurological Headaches?: No Dizziness?: No  Psychologic Depression?: No Anxiety?: No  Physical Exam: BP 140/80   Pulse 84   Ht 5\' 5"  (1.651 m)   Wt 154 lb (69.9 kg)   BMI 25.63 kg/m   Constitutional: Well nourished. Alert and oriented, No acute distress. HEENT: [place] AT, moist mucus membranes. Trachea midline, no masses. Cardiovascular: No clubbing, cyanosis, or edema. Respiratory: Normal respiratory effort, no increased work of breathing. GI: Abdomen is soft, non tender, non distended, no abdominal masses. Liver and spleen not palpable.  No hernias appreciated.  Stool sample for occult testing is not indicated.   GU: No CVA tenderness.  No bladder fullness or masses.  Atrophic external genitalia, normal pubic hair distribution, no lesions.  Normal urethral meatus, no lesions, no prolapse, no discharge.   No urethral masses, tenderness and/or tenderness. No bladder fullness, tenderness or masses. Pale vagina mucosa, poor estrogen effect, no discharge, no lesions, good pelvic support, no cystocele or rectocele noted.  Cervical, uterus and ovaries are surgically absent.    Anus and perineum are without rashes or lesions.   Skin: No rashes, bruises or suspicious lesions. Lymph: No cervical or inguinal adenopathy. Neurologic: Grossly intact, no focal deficits, moving all 4 extremities. Psychiatric: Normal mood and affect.  Laboratory Data: Lab Results  Component  Value Date   WBC 12.9 (H) 07/04/2013   HGB 9.4 (L) 07/04/2013   HCT 27.5 (L) 07/04/2013   MCV 89 07/04/2013   PLT 277 07/04/2013    Lab Results  Component Value Date   CREATININE 0.95 07/04/2013    Lab Results  Component Value Date   AST 30 07/02/2013   Lab Results  Component Value Date   ALT 30 07/02/2013     Urinalysis Likely contaminated.  See EPIC.    Pertinent Imaging: Results for ILEENE, FLESZAR (MRN [id number]) as of 12/06/2016 14:27  Ref. Range 12/05/2016 10:18  Scan Result Unknown 0   CLINICAL DATA:  CT of the abdomen and pelvis with contrast. Pt with LLQ pain x 1 week. Hx of diverticulitis, right side breast CA with mastectomy, chemo and radiation. No hx of trauma or injury. Hx of gallbladder removal.  EXAM: CT ABDOMEN AND PELVIS WITH CONTRAST  TECHNIQUE: Multidetector CT imaging of the abdomen and pelvis was performed using the standard protocol following bolus administration of intravenous contrast.  CONTRAST:  156mL OMNIPAQUE IOHEXOL 300 MG/ML  SOLN  COMPARISON:  07/02/2013  FINDINGS: Prior right mastectomy and lymph node dissection. Scattered ground-glass opacities in the visualized lungs, likely atelectasis. Heart is mildly enlarged. No pleural effusions.  Mild elevation of the right hemidiaphragm. Mild diffuse fatty infiltration of the liver. Prior cholecystectomy. Spleen, pancreas, adrenals and kidneys are unremarkable.  Descending colonic and sigmoid diverticulosis. Scattered diverticula elsewhere within the colon. No active diverticulitis. Stomach and small bowel are unremarkable. Aorta is tortuous, normal caliber. No free fluid, free air or adenopathy. Prior hysterectomy. No adnexal masses. Urinary bladder decompressed, grossly unremarkable.  Scoliosis and degenerative changes in the lumbar spine. No acute or focal bony abnormality.  IMPRESSION: Fatty infiltration of the liver.  Colonic diverticulosis, most pronounced  in the left colon. No evidence of active diverticulitis.  No acute findings in the abdomen or pelvis.   Electronically Signed   By: Rolm Baptise M.D.   On: 08/10/2015 12:28  Assessment & Plan:    1. Recurrent UTI's  - Patient is instructed to increase her water intake until the urine is pale yellow or clear.  I have advised her to take  probiotics (yogurt, oral pills or vaginal suppositories), take cranberry pills or drink the juice. She is to take Vitamin C 1,000 mg daily to acidify the urine. She should also avoid soaking in tubs and wipe front to back after urinating.  She may benefit from core strengthening exercises.  We can refer her to PT if she desires.    - Because of her history of recurrent UTI's, I have asked the patient to contact our office if she should experience symptoms of urinary tract infection so that we can CATH her for an urine specimen for urinalysis and culture. This is to prevent a skin contaminant from showing up in the urine culture.  If she should have her symptoms after hours or cannot get to our office, she should notify her other providers that she needs a catheterized specimen for UA and culture.   - I reviewed the symptoms of a urinary tract infection, such as a worsening of urinary urgency and frequency, dysuria, which is painful urination and not the pain of urine hitting sensitive perineal skin, hematuria, foul-smelling urine, suprapubic pain or mental status changes. Fevers, chills, nausea and or vomiting can also be signs of a possible UTI.  Positive urinalyses and positive urine cultures that are not associated with urinary symptoms should not be treated with antibiotics.    - I explained to the patient that being exposed to unnecessary antibiotics can put her at risk for increasing resistance of the bacteria to antibiotics, C. difficile and the side effects of the antibiotics.    - obtain RUS to evaluate for stones/hydronephrosis  2. Vaginal atrophy  -  not a candidate for estrogen vaginal cream due to recent diagnosis of breast cancer  - advised the patient to use vaginal probiotics and coconut/olive oil for discomfort                               Return for return for renal ultrasound report.  These notes generated with voice recognition software. I apologize for typographical errors.  Zara Council, [facility] [address], [place], [place] [address] [phone]

## 2016-12-05 NOTE — Patient Instructions (Signed)
                                             Urinary Tract Infection Prevention Patient Education Stay Hydrated: Urinary tract infections (UTIs) are less likely to occur in someone who is drinking enough water to promote regular urination, so it is very important to stay hydrated in order to help flush out bacteria from the urinary tract. Respond to "Nature's Call": It is always a good idea to urinate as soon as you feel the need. While "holding it in" does not directly cause an infection, it can cause overdistension that can damage the lining of the bladder, making it more vulnerable to bacteria. Remove Tampons Before Going: Remember to always take out tampons before urinating, and change tampons often.  Practice Proper Bathroom Hygiene: To keep bacteria near the urethral opening to a minimum, it is important to practice proper wiping techniques (i.e. front to back wiping) to help prevent rectal bacteria from entering the uretro-genital area. It can also be helpful to take showers and avoid soaking in the bathtub.  Take a Vitamin C Supplement: About 1,000 milligrams of vitamin C taken daily can help inhibit the growth of some bacteria by acidifying the urine. Maintain Control with Cranberries: Cranberries contain hippuronic acid, which is a natural antiseptic that may help prevent the adherence of bacteria to the bladder lining. Drinking 100% pure cranberry juice or taking over the counter cranberry supplements twice daily may help to prevent an infection. However, it is important to note that cranberry juices/supplements are not helpful once a urinary tract infection (UTI) is present. Strengthen Your Core: Often, a lazy bladder (unable to empty urine properly) occurs due to lower back problem, so consider doing exercises to help strengthen your back, pelvic floor, and stomach muscles. Use vaginal probiotics  Use olive oil or coconut oil for discomfort Pay Attention to Your Urine: Your urine can change  color for a variety of reasons, including from the medications you take, so pay close attention to it to monitor your overall health. One key thing to note is that if your urine is typically a darker yellow, your body is dehydrated, so you need to step up your water intake.

## 2016-12-09 DIAGNOSIS — Z859 Personal history of malignant neoplasm, unspecified: Secondary | ICD-10-CM | POA: Diagnosis not present

## 2016-12-09 DIAGNOSIS — D1800 Hemangioma unspecified site: Secondary | ICD-10-CM | POA: Diagnosis not present

## 2016-12-09 DIAGNOSIS — Z85820 Personal history of malignant melanoma of skin: Secondary | ICD-10-CM | POA: Diagnosis not present

## 2016-12-09 DIAGNOSIS — D229 Melanocytic nevi, unspecified: Secondary | ICD-10-CM | POA: Diagnosis not present

## 2016-12-09 DIAGNOSIS — Z85828 Personal history of other malignant neoplasm of skin: Secondary | ICD-10-CM | POA: Diagnosis not present

## 2016-12-09 DIAGNOSIS — D1801 Hemangioma of skin and subcutaneous tissue: Secondary | ICD-10-CM | POA: Diagnosis not present

## 2016-12-09 DIAGNOSIS — L821 Other seborrheic keratosis: Secondary | ICD-10-CM | POA: Diagnosis not present

## 2016-12-22 ENCOUNTER — Ambulatory Visit
Admission: RE | Admit: 2016-12-22 | Discharge: 2016-12-22 | Disposition: A | Discharge status: Home / Self Care | Payer: Medicare HMO | Source: Ambulatory Visit | Attending: Urology | Admitting: Urology

## 2016-12-22 DIAGNOSIS — N390 Urinary tract infection, site not specified: Secondary | ICD-10-CM | POA: Diagnosis not present

## 2016-12-22 DIAGNOSIS — N3289 Other specified disorders of bladder: Secondary | ICD-10-CM | POA: Diagnosis not present

## 2016-12-26 ENCOUNTER — Encounter: Payer: Self-pay | Admitting: Urology

## 2016-12-26 ENCOUNTER — Ambulatory Visit (INDEPENDENT_AMBULATORY_CARE_PROVIDER_SITE_OTHER): Payer: Medicare HMO | Admitting: Urology

## 2016-12-26 VITALS — BP 164/88 | HR 90 | Ht 66.5 in | Wt 155.0 lb

## 2016-12-26 DIAGNOSIS — N952 Postmenopausal atrophic vaginitis: Secondary | ICD-10-CM | POA: Diagnosis not present

## 2016-12-26 DIAGNOSIS — N390 Urinary tract infection, site not specified: Secondary | ICD-10-CM | POA: Diagnosis not present

## 2016-12-26 NOTE — Progress Notes (Signed)
12/26/2016 10:35 AM   Sandy Hickman [birth year] [id number]  Referring provider: Ezequiel Kayser, MD [facility] [place], [place] [address]  Chief Complaint  Patient presents with  . Results    Renal US    HPI: Patient is an 83 year old Caucasian female who presents today to discuss renal ultrasound results.  Background history Patient is a 30 -year-old Caucasian female who is referred to [place] by, [facility], for recurrent urinary tract infections.  Patient states that she has had three urinary tract infections over the last few months.  Her symptoms with a urinary tract infection consist of pelvic pressure, urgency and frequency and dysuria.  She denies back pain, abdominal pain or flank pain.  She has not had any recent fevers, chills, nausea or vomiting.  She does have a history of nephrolithiasis and GU surgery.  Reviewing her records,  she has had three documented urinary tract infections.  Two for pan-sensitive E.coli and one for Klebsiella.   She is not sexually active.  She is post menopausal.  She admits to diarrhea on occasions.   She does engage in good perineal hygiene. She does not take tub baths.   She does not have incontinence.  She is not having pain with bladder filling.  She underwent contrast CT in 07/2015 and kidneys and bladder were unremarkable.  I have independently reviewed the films.  She is not drinking a lot of water daily.  She does admit to drinking sweet tea and coffee.  She does drink cranberry juice.  She is not experiencing symptoms of an urinary tract infection at this time.  UA most likely contaminated.  PVR is 0 mL.    RUS performed on 12/21/2016 noted no hydronephrosis nor evidence of echogenic shadowing stones.  No definite bladder abnormality but the images of the bladder limited by partial distention.  I have independently reviewed the films.  Today, she is not experiencing any symptoms at this time.  She denies gross hematuria,  suprapubic pain or dysuria.  She has not fevers, chills, nausea and vomiting.     PMH: Past Medical History:  Diagnosis Date  . Arrhythmia   . Breast cancer ([place]) 2006   rt mastectomy  . Cancer (HCC)    breast  . GERD (gastroesophageal reflux disease)   . Glaucoma   . HLD (hyperlipidemia)   . Hypertension     Surgical History: Past Surgical History:  Procedure Laterality Date  . ABDOMINAL HYSTERECTOMY    . BLADDER SUSPENSION     15 years ago  . BREAST SURGERY    . cataract surgery Bilateral   . CHOLECYSTECTOMY    . JOINT REPLACEMENT    . KNEE SURGERY Right    2016  . MASTECTOMY Right 2006   rt mast /rad/chemo  . ROTATOR CUFF REPAIR     13 years    Home Medications:  Allergies as of 12/26/2016      Reactions   Neosporin [neomycin-bacitracin Zn-polymyx] Dermatitis   Sulfa Antibiotics Itching, Rash   Enalapril Other (See Comments)   Oxycodone-acetaminophen Itching      Medication List       Accurate as of 12/26/16 10:35 AM. Always use your most recent med list.          anastrozole 1 MG tablet Commonly known as:  ARIMIDEX Take 1 mg by mouth daily.   aspirin 81 MG tablet Take 81 mg by mouth daily.   azelastine 0.1 % nasal  spray Commonly known as:  ASTELIN Place into the nose.   cefUROXime 250 MG tablet Commonly known as:  CEFTIN Take 1 tablet (250 mg total) by mouth 2 (two) times daily with a meal.   CENTRUM SILVER PO Take 1 each by mouth daily.   citalopram 10 MG tablet Commonly known as:  CELEXA Take 10 mg by mouth daily.   clobetasol cream 0.05 % Commonly known as:  TEMOVATE Apply topically.   cyanocobalamin 500 MCG tablet Take by mouth.   ezetimibe 10 MG tablet Commonly known as:  ZETIA Take by mouth.   Fish Oil 1200 MG Cpdr Take 1 each by mouth daily.   FISH OIL PO Take by mouth.   gabapentin 300 MG capsule Commonly known as:  NEURONTIN Take by mouth.   gemfibrozil 600 MG tablet Commonly known as:  LOPID Take 600 mg by  mouth daily.   HYDROcodone-acetaminophen 5-325 MG tablet Commonly known as:  NORCO/VICODIN Take by mouth.   latanoprost 0.005 % ophthalmic solution Commonly known as:  XALATAN Place 1 drop into both eyes at bedtime.   loratadine 10 MG tablet Commonly known as:  CLARITIN Take by mouth.   losartan 50 MG tablet Commonly known as:  COZAAR Take 100 mg by mouth daily.   Magnesium 500 MG Caps Take 1 each by mouth daily.   magnesium gluconate 500 MG tablet Commonly known as:  MAGONATE Take by mouth.   Melatonin 10 MG Tabs Take by mouth.   mirtazapine 15 MG tablet Commonly known as:  REMERON Take by mouth.   omeprazole 20 MG capsule Commonly known as:  PRILOSEC Take 20 mg by mouth daily.   ondansetron 4 MG tablet Commonly known as:  ZOFRAN Take 1 tablet (4 mg total) by mouth every 6 (six) hours.   ondansetron 4 MG tablet Commonly known as:  ZOFRAN Take 1 tablet (4 mg total) by mouth every 6 (six) hours.   ondansetron 4 MG tablet Commonly known as:  ZOFRAN Take 1 tablet (4 mg total) by mouth every 6 (six) hours.   polycarbophil 625 MG tablet Commonly known as:  FIBERCON Take 1,250 mg by mouth daily.   PROBIOTIC ACIDOPHILUS PO Take by mouth.   Timolol Maleate PF 0.25 % Soln Apply 1 drop to eye daily.       Allergies:  Allergies  Allergen Reactions  . Neosporin [Neomycin-Bacitracin Zn-Polymyx] Dermatitis  . Sulfa Antibiotics Itching and Rash  . Enalapril Other (See Comments)  . Oxycodone-Acetaminophen Itching    Family History: Family History  Problem Relation Age of Onset  . Breast cancer Daughter 39  . Prostate cancer Neg Hx   . Bladder Cancer Neg Hx   . Kidney cancer Neg Hx     Social History:  reports that she has never smoked. She has never used smokeless tobacco. She reports that she drinks alcohol. She reports that she does not use drugs.  ROS: UROLOGY Frequent Urination?: No Hard to postpone urination?: No Burning/pain with urination?:  No Get up at night to urinate?: No Leakage of urine?: No Urine stream starts and stops?: No Trouble starting stream?: No Do you have to strain to urinate?: No Blood in urine?: No Urinary tract infection?: No Sexually transmitted disease?: No Injury to kidneys or bladder?: No Painful intercourse?: No Weak stream?: No Currently pregnant?: No Vaginal bleeding?: No Last menstrual period?: n  Gastrointestinal Nausea?: No Vomiting?: No Indigestion/heartburn?: No Diarrhea?: No Constipation?: No  Constitutional Fever: No Night sweats?: No Weight loss?:  No Fatigue?: No  Skin Skin rash/lesions?: No Itching?: No  Eyes Blurred vision?: No Double vision?: No  Ears/Nose/Throat Sore throat?: No Sinus problems?: No  Hematologic/Lymphatic Swollen glands?: No Easy bruising?: No  Cardiovascular Leg swelling?: No Chest pain?: No  Respiratory Cough?: No Shortness of breath?: No  Endocrine Excessive thirst?: No  Musculoskeletal Back pain?: No Joint pain?: No  Neurological Headaches?: No Dizziness?: No  Psychologic Depression?: No Anxiety?: No  Physical Exam: BP (!) 164/88   Pulse 90   Ht 5' 6.5" (1.689 m)   Wt 155 lb (70.3 kg)   BMI 24.64 kg/m   Constitutional: Well nourished. Alert and oriented, No acute distress. HEENT: [place] AT, moist mucus membranes. Trachea midline, no masses. Cardiovascular: No clubbing, cyanosis, or edema. Respiratory: Normal respiratory effort, no increased work of breathing. GI: Abdomen is soft, non tender, non distended, no abdominal masses. Liver and spleen not palpable.  No hernias appreciated.  Stool sample for occult testing is not indicated.   GU: No CVA tenderness.  No bladder fullness or masses.  Atrophic external genitalia, normal pubic hair distribution, no lesions.  Normal urethral meatus, no lesions, no prolapse, no discharge.   No urethral masses, tenderness and/or tenderness. No bladder fullness, tenderness or masses.  Pale vagina mucosa, poor estrogen effect, no discharge, no lesions, good pelvic support, no cystocele or rectocele noted.  Cervical, uterus and ovaries are surgically absent.    Anus and perineum are without rashes or lesions.   Skin: No rashes, bruises or suspicious lesions. Lymph: No cervical or inguinal adenopathy. Neurologic: Grossly intact, no focal deficits, moving all 4 extremities. Psychiatric: Normal mood and affect.  Laboratory Data: Lab Results  Component Value Date   WBC 12.9 (H) 07/04/2013   HGB 9.4 (L) 07/04/2013   HCT 27.5 (L) 07/04/2013   MCV 89 07/04/2013   PLT 277 07/04/2013    Lab Results  Component Value Date   CREATININE 0.95 07/04/2013    Lab Results  Component Value Date   AST 30 07/02/2013   Lab Results  Component Value Date   ALT 30 07/02/2013   Pertinent imaging CLINICAL DATA:  Recurrent urinary tract infection, history of urinary tract stones.  EXAM: RENAL / URINARY TRACT ULTRASOUND COMPLETE  COMPARISON:  Abdominopelvic CT scan of August 10, 2015  FINDINGS: Right Kidney:  Length: 9.7 cm. Echogenicity within normal limits. No mass or hydronephrosis visualized.  Left Kidney:  Length: 10.2 cm. Echogenicity within normal limits. No mass or hydronephrosis visualized.  Bladder:  The urinary bladder is only partially distended due to recent voiding. No bladder stones are observed.  IMPRESSION: No hydronephrosis nor evidence of echogenic shadowing stones. No definite bladder abnormality but the images of the bladder limited by partial distention.   Electronically Signed   By: David  Martinique M.D.   On: 12/22/2016 11:46   Assessment & Plan:    1. Recurrent UTI's  - Reviewed UTI prevention strategies with patient  - Encouraged her to contact our office if she should experience symptoms of an UTI for further instruction and management  2. Vaginal atrophy  - not a candidate for estrogen vaginal cream due to recent  diagnosis of breast cancer  - advised the patient to use vaginal probiotics and coconut/olive oil for discomfort                               Return in about 1 year (around 12/26/2017) for  exam.  These notes generated with voice recognition software. I apologize for typographical errors.  Zara Council, [facility] [address], [place], [place] [address] [phone]

## 2017-01-18 ENCOUNTER — Encounter: Payer: Self-pay | Admitting: Emergency Medicine

## 2017-01-18 ENCOUNTER — Ambulatory Visit
Admission: EM | Admit: 2017-01-18 | Discharge: 2017-01-18 | Disposition: A | Discharge status: Home / Self Care | Payer: Medicare HMO | Attending: Family Medicine | Admitting: Family Medicine

## 2017-01-18 DIAGNOSIS — B029 Zoster without complications: Secondary | ICD-10-CM

## 2017-01-18 MED ORDER — HYDROCODONE-ACETAMINOPHEN 5-325 MG PO TABS: 1.0000 | ORAL_TABLET | Freq: Four times a day (QID) | ORAL | 0 refills | Status: DC | PRN

## 2017-01-18 MED ORDER — FAMCICLOVIR 500 MG PO TABS: 500.0000 mg | ORAL_TABLET | Freq: Three times a day (TID) | ORAL | 0 refills | Status: DC

## 2017-01-18 NOTE — Discharge Instructions (Signed)
Take Ibuprofen 200 mg with tylenol 500 mg together 3-4 times daily for pain.

## 2017-01-18 NOTE — ED Triage Notes (Signed)
Rash on lower back going around to abdomen. Painful fills like its burning.

## 2017-01-18 NOTE — ED Provider Notes (Signed)
CSN: [id number]     Arrival date & time 01/18/17  1402 History   First MD Initiated Contact with Patient 01/18/17 1512     Chief Complaint  Patient presents with  . Rash   (Consider location/radiation/quality/duration/timing/severity/associated sxs/prior Treatment) HPI  83 year old female presents with a painful rash on her left back radiating around to her abdomen but not crossing the midline. It is approximately L1-L2 level. This is a burning sensation. She says she was unable to press her back against the PICU today at church because of the pain. It is too small ossicle is over her left back no other identifiable rash. Is very fatigued. He received Zovirax many years ago.      Past Medical History:  Diagnosis Date  . Arrhythmia   . Breast cancer ([place]) 2006   rt mastectomy  . Cancer ([facility])    breast  . GERD (gastroesophageal reflux disease)   . Glaucoma   . HLD (hyperlipidemia)   . Hypertension    Past Surgical History:  Procedure Laterality Date  . ABDOMINAL HYSTERECTOMY    . BLADDER SUSPENSION     15 years ago  . BREAST SURGERY    . cataract surgery Bilateral   . CHOLECYSTECTOMY    . JOINT REPLACEMENT    . KNEE SURGERY Right    2016  . MASTECTOMY Right 2006   rt mast /rad/chemo  . ROTATOR CUFF REPAIR     13 years   Family History  Problem Relation Age of Onset  . Breast cancer Daughter 27  . Hypertension Father   . Stroke Father   . Diabetes Sister   . Prostate cancer Neg Hx   . Bladder Cancer Neg Hx   . Kidney cancer Neg Hx    Social History  Substance Use Topics  . Smoking status: Never Smoker  . Smokeless tobacco: Never Used  . Alcohol use Yes     Comment: occ   OB History    No data available     Review of Systems  Constitutional: Positive for activity change and fatigue. Negative for chills and fever.  Skin: Positive for rash.  All other systems reviewed and are negative.   Allergies  Neosporin [neomycin-bacitracin zn-polymyx]; Sulfa  antibiotics; Enalapril; and Oxycodone-acetaminophen  Home Medications   Prior to Admission medications   Medication Sig Start Date End Date Taking? Authorizing Provider  anastrozole (ARIMIDEX) 1 MG tablet Take 1 mg by mouth daily.   Yes Historical Provider, MD  aspirin 81 MG tablet Take 81 mg by mouth daily.   Yes Historical Provider, MD  azelastine (ASTELIN) 0.1 % nasal spray Place into the nose.   Yes Historical Provider, MD  citalopram (CELEXA) 10 MG tablet Take 10 mg by mouth daily.   Yes Historical Provider, MD  clobetasol cream (TEMOVATE) 0.05 % Apply topically. 05/02/16  Yes Historical Provider, MD  cyanocobalamin 500 MCG tablet Take by mouth.   Yes Historical Provider, MD  ezetimibe (ZETIA) 10 MG tablet Take by mouth. 03/10/16 03/10/17 Yes Historical Provider, MD  gabapentin (NEURONTIN) 300 MG capsule Take by mouth. 09/23/16  Yes Historical Provider, MD  gemfibrozil (LOPID) 600 MG tablet Take 600 mg by mouth daily.   Yes Historical Provider, MD  Lactobacillus (PROBIOTIC ACIDOPHILUS PO) Take by mouth.   Yes Historical Provider, MD  latanoprost (XALATAN) 0.005 % ophthalmic solution Place 1 drop into both eyes at bedtime.   Yes Historical Provider, MD  loratadine (CLARITIN) 10 MG tablet Take by mouth.  Yes Historical Provider, MD  magnesium gluconate (MAGONATE) 500 MG tablet Take by mouth.   Yes Historical Provider, MD  Melatonin 10 MG TABS Take by mouth.   Yes Historical Provider, MD  mirtazapine (REMERON) 15 MG tablet Take by mouth. 09/09/16  Yes Historical Provider, MD  Multiple Vitamins-Minerals (CENTRUM SILVER PO) Take 1 each by mouth daily.   Yes Historical Provider, MD  Omega-3 Fatty Acids (FISH OIL PO) Take by mouth.   Yes Historical Provider, MD  Omega-3 Fatty Acids (FISH OIL) 1200 MG CPDR Take 1 each by mouth daily.   Yes Historical Provider, MD  omeprazole (PRILOSEC) 20 MG capsule Take 20 mg by mouth daily.   Yes Historical Provider, MD  famciclovir (FAMVIR) 500 MG tablet Take 1  tablet (500 mg total) by mouth 3 (three) times daily. 01/18/17   Lorin Picket, PA-C  HYDROcodone-acetaminophen (NORCO/VICODIN) 5-325 MG tablet Take 1 tablet by mouth every 6 (six) hours as needed for moderate pain or severe pain. 01/18/17   Lorin Picket, PA-C  ondansetron (ZOFRAN) 4 MG tablet Take 1 tablet (4 mg total) by mouth every 6 (six) hours. Patient not taking: Reported on 12/05/2016 10/30/16   Lysbeth Penner, FNP  ondansetron (ZOFRAN) 4 MG tablet Take 1 tablet (4 mg total) by mouth every 6 (six) hours. Patient not taking: Reported on 12/05/2016 10/30/16   Lysbeth Penner, FNP  ondansetron (ZOFRAN) 4 MG tablet Take 1 tablet (4 mg total) by mouth every 6 (six) hours. Patient not taking: Reported on 12/05/2016 10/30/16   Lysbeth Penner, FNP  polycarbophil (FIBERCON) 625 MG tablet Take 1,250 mg by mouth daily.    Historical Provider, MD  Timolol Maleate PF 0.25 % SOLN Apply 1 drop to eye daily.    Historical Provider, MD   Meds Ordered and Administered this Visit  Medications - No data to display  BP (!) 144/65 (BP Location: Right Arm)   Pulse 77   Temp 98.2 F (36.8 C) (Oral)   Resp 16   Ht 5\' 6"  (1.676 m)   Wt 155 lb (70.3 kg)   SpO2 99%   BMI 25.02 kg/m  No data found.   Physical Exam  Constitutional: She is oriented to person, place, and time. She appears well-developed and well-nourished. No distress.  HENT:  Head: Normocephalic and atraumatic.  Eyes: Pupils are equal, round, and reactive to light.  Neck: Normal range of motion.  Musculoskeletal: Normal range of motion.  Neurological: She is alert and oriented to person, place, and time.  Skin: Skin is warm and dry. Rash noted. She is not diaphoretic.  Examination of her and abdomen was performed Margarita Grizzle, Therapist, sports as Education officer, museum. The back left at the L 1 L2 level II small vesicles round half a millimeter in size and not fluid-filled with an erythematous base. Other lesions are seen. She is very tender along the  nerve root particularly over her abdomen on the left.  Psychiatric: She has a normal mood and affect. Her behavior is normal. Judgment and thought content normal.  Nursing note and vitals reviewed.   Urgent Care Course     Procedures (including critical care time)  Labs Review Labs Reviewed - No data to display  Imaging Review No results found.   Visual Acuity Review  Right Eye Distance:   Left Eye Distance:   Bilateral Distance:    Right Eye Near:   Left Eye Near:    Bilateral Near:  MDM   1. Herpes zoster without complication    Discharge Medication List as of 01/18/2017  3:30 PM    START taking these medications   Details  famciclovir (FAMVIR) 500 MG tablet Take 1 tablet (500 mg total) by mouth 3 (three) times daily., Starting Sun 01/18/2017, Normal      Plan: 1. Test/x-ray results and diagnosis reviewed with patient 2. rx as per orders; risks, benefits, potential side effects reviewed with patient 3. Recommend supportive treatment with Use of cool compresses on the areas of pain for comfort recommend that she follow-up with her primary care physician next week. I have given her Vicodin which she states she is able to take in the past and is not allergic to that medication for severe pain but she will use Tylenol and ibuprofen for minor pain 4. F/u prn if symptoms worsen or don't improve     Lorin Picket, PA-C 01/18/17 1539

## 2017-01-22 DIAGNOSIS — E119 Type 2 diabetes mellitus without complications: Secondary | ICD-10-CM | POA: Diagnosis not present

## 2017-01-22 DIAGNOSIS — I10 Essential (primary) hypertension: Secondary | ICD-10-CM | POA: Diagnosis not present

## 2017-01-22 DIAGNOSIS — B029 Zoster without complications: Secondary | ICD-10-CM | POA: Diagnosis not present

## 2017-01-26 ENCOUNTER — Telehealth: Payer: Self-pay | Admitting: Urology

## 2017-01-26 NOTE — Telephone Encounter (Signed)
Pt stated that she still has 2 open sores and the rest of the lesions are going away. Per Larene Beach pt can come to the office and have a cath specimen. Made pt aware. Pt then stated that she does not want to come to [place] so she will go see her PCP.

## 2017-01-26 NOTE — Telephone Encounter (Signed)
Patient has shingles and is being treated by Dr. Dorthula Perfect.  She feels that she has a UTI (burning, urgency) and would like to have her urine sent for culture.   Please advise.

## 2017-01-26 NOTE — Telephone Encounter (Signed)
She can come to our office for a CATH specimen.

## 2017-01-27 DIAGNOSIS — R300 Dysuria: Secondary | ICD-10-CM | POA: Diagnosis not present

## 2017-01-27 DIAGNOSIS — A499 Bacterial infection, unspecified: Secondary | ICD-10-CM | POA: Diagnosis not present

## 2017-01-27 DIAGNOSIS — N390 Urinary tract infection, site not specified: Secondary | ICD-10-CM | POA: Diagnosis not present

## 2017-03-05 DIAGNOSIS — R69 Illness, unspecified: Secondary | ICD-10-CM | POA: Diagnosis not present

## 2017-03-10 ENCOUNTER — Other Ambulatory Visit: Payer: Self-pay | Admitting: Internal Medicine

## 2017-03-10 DIAGNOSIS — I10 Essential (primary) hypertension: Secondary | ICD-10-CM | POA: Diagnosis not present

## 2017-03-10 DIAGNOSIS — Z1231 Encounter for screening mammogram for malignant neoplasm of breast: Secondary | ICD-10-CM | POA: Diagnosis not present

## 2017-03-10 DIAGNOSIS — Z853 Personal history of malignant neoplasm of breast: Secondary | ICD-10-CM | POA: Diagnosis not present

## 2017-03-10 DIAGNOSIS — E1129 Type 2 diabetes mellitus with other diabetic kidney complication: Secondary | ICD-10-CM | POA: Diagnosis not present

## 2017-03-10 DIAGNOSIS — E782 Mixed hyperlipidemia: Secondary | ICD-10-CM | POA: Diagnosis not present

## 2017-03-10 DIAGNOSIS — K219 Gastro-esophageal reflux disease without esophagitis: Secondary | ICD-10-CM | POA: Diagnosis not present

## 2017-03-10 DIAGNOSIS — R69 Illness, unspecified: Secondary | ICD-10-CM | POA: Diagnosis not present

## 2017-03-10 DIAGNOSIS — E538 Deficiency of other specified B group vitamins: Secondary | ICD-10-CM | POA: Diagnosis not present

## 2017-03-10 DIAGNOSIS — R809 Proteinuria, unspecified: Secondary | ICD-10-CM | POA: Diagnosis not present

## 2017-03-10 DIAGNOSIS — Z79899 Other long term (current) drug therapy: Secondary | ICD-10-CM | POA: Diagnosis not present

## 2017-03-12 DIAGNOSIS — J028 Acute pharyngitis due to other specified organisms: Secondary | ICD-10-CM | POA: Diagnosis not present

## 2017-03-18 ENCOUNTER — Ambulatory Visit
Admission: RE | Admit: 2017-03-18 | Discharge: 2017-03-18 | Disposition: A | Discharge status: Home / Self Care | Payer: Medicare HMO | Source: Ambulatory Visit | Attending: Internal Medicine | Admitting: Internal Medicine

## 2017-03-18 DIAGNOSIS — R928 Other abnormal and inconclusive findings on diagnostic imaging of breast: Secondary | ICD-10-CM | POA: Insufficient documentation

## 2017-03-18 DIAGNOSIS — Z1231 Encounter for screening mammogram for malignant neoplasm of breast: Secondary | ICD-10-CM | POA: Diagnosis not present

## 2017-03-18 DIAGNOSIS — Z853 Personal history of malignant neoplasm of breast: Secondary | ICD-10-CM | POA: Diagnosis not present

## 2017-03-18 HISTORY — DX: Personal history of antineoplastic chemotherapy: Z92.21

## 2017-03-18 HISTORY — DX: Personal history of irradiation: Z92.3

## 2017-03-23 ENCOUNTER — Other Ambulatory Visit: Payer: Self-pay | Admitting: Internal Medicine

## 2017-03-23 DIAGNOSIS — R928 Other abnormal and inconclusive findings on diagnostic imaging of breast: Secondary | ICD-10-CM

## 2017-03-23 DIAGNOSIS — N6320 Unspecified lump in the left breast, unspecified quadrant: Secondary | ICD-10-CM

## 2017-03-26 ENCOUNTER — Ambulatory Visit
Admission: RE | Admit: 2017-03-26 | Discharge: 2017-03-26 | Disposition: A | Discharge status: Home / Self Care | Payer: Medicare HMO | Source: Ambulatory Visit | Attending: Internal Medicine | Admitting: Internal Medicine

## 2017-03-26 DIAGNOSIS — N6320 Unspecified lump in the left breast, unspecified quadrant: Secondary | ICD-10-CM | POA: Diagnosis present

## 2017-03-26 DIAGNOSIS — R928 Other abnormal and inconclusive findings on diagnostic imaging of breast: Secondary | ICD-10-CM

## 2017-03-26 DIAGNOSIS — R922 Inconclusive mammogram: Secondary | ICD-10-CM | POA: Diagnosis not present

## 2017-03-26 DIAGNOSIS — N6489 Other specified disorders of breast: Secondary | ICD-10-CM | POA: Diagnosis not present

## 2017-04-07 DIAGNOSIS — H401111 Primary open-angle glaucoma, right eye, mild stage: Secondary | ICD-10-CM | POA: Diagnosis not present

## 2017-05-04 DIAGNOSIS — E1129 Type 2 diabetes mellitus with other diabetic kidney complication: Secondary | ICD-10-CM | POA: Diagnosis not present

## 2017-05-04 DIAGNOSIS — I10 Essential (primary) hypertension: Secondary | ICD-10-CM | POA: Diagnosis not present

## 2017-05-04 DIAGNOSIS — R809 Proteinuria, unspecified: Secondary | ICD-10-CM | POA: Diagnosis not present

## 2017-06-09 DIAGNOSIS — Z85828 Personal history of other malignant neoplasm of skin: Secondary | ICD-10-CM | POA: Diagnosis not present

## 2017-06-09 DIAGNOSIS — Z859 Personal history of malignant neoplasm, unspecified: Secondary | ICD-10-CM | POA: Diagnosis not present

## 2017-06-09 DIAGNOSIS — Z872 Personal history of diseases of the skin and subcutaneous tissue: Secondary | ICD-10-CM | POA: Diagnosis not present

## 2017-06-09 DIAGNOSIS — L720 Epidermal cyst: Secondary | ICD-10-CM | POA: Diagnosis not present

## 2017-06-09 DIAGNOSIS — L570 Actinic keratosis: Secondary | ICD-10-CM | POA: Diagnosis not present

## 2017-06-09 DIAGNOSIS — Z85820 Personal history of malignant melanoma of skin: Secondary | ICD-10-CM | POA: Diagnosis not present

## 2017-06-18 DIAGNOSIS — C8511 Unspecified B-cell lymphoma, lymph nodes of head, face, and neck: Secondary | ICD-10-CM | POA: Diagnosis not present

## 2017-06-18 DIAGNOSIS — L720 Epidermal cyst: Secondary | ICD-10-CM | POA: Diagnosis not present

## 2017-06-30 DIAGNOSIS — C884 Extranodal marginal zone B-cell lymphoma of mucosa-associated lymphoid tissue [MALT-lymphoma]: Secondary | ICD-10-CM | POA: Diagnosis not present

## 2017-07-02 DIAGNOSIS — A499 Bacterial infection, unspecified: Secondary | ICD-10-CM | POA: Diagnosis not present

## 2017-07-02 DIAGNOSIS — N390 Urinary tract infection, site not specified: Secondary | ICD-10-CM | POA: Diagnosis not present

## 2017-07-02 DIAGNOSIS — R300 Dysuria: Secondary | ICD-10-CM | POA: Diagnosis not present

## 2017-07-17 ENCOUNTER — Encounter: Payer: Self-pay | Admitting: Oncology

## 2017-07-17 ENCOUNTER — Inpatient Hospital Stay: Payer: Medicare HMO

## 2017-07-17 ENCOUNTER — Ambulatory Visit: Payer: Medicare HMO | Admitting: Oncology

## 2017-07-17 ENCOUNTER — Inpatient Hospital Stay: Payer: Medicare HMO | Attending: Oncology | Admitting: Oncology

## 2017-07-17 VITALS — BP 157/83 | HR 68 | Temp 97.6°F | Resp 18 | Ht 66.0 in | Wt 155.0 lb

## 2017-07-17 DIAGNOSIS — N189 Chronic kidney disease, unspecified: Secondary | ICD-10-CM | POA: Diagnosis not present

## 2017-07-17 DIAGNOSIS — Z85828 Personal history of other malignant neoplasm of skin: Secondary | ICD-10-CM | POA: Diagnosis not present

## 2017-07-17 DIAGNOSIS — E785 Hyperlipidemia, unspecified: Secondary | ICD-10-CM | POA: Diagnosis not present

## 2017-07-17 DIAGNOSIS — Z87442 Personal history of urinary calculi: Secondary | ICD-10-CM | POA: Diagnosis not present

## 2017-07-17 DIAGNOSIS — C884 Extranodal marginal zone B-cell lymphoma of mucosa-associated lymphoid tissue [MALT-lymphoma]: Secondary | ICD-10-CM | POA: Insufficient documentation

## 2017-07-17 DIAGNOSIS — Z923 Personal history of irradiation: Secondary | ICD-10-CM

## 2017-07-17 DIAGNOSIS — H409 Unspecified glaucoma: Secondary | ICD-10-CM | POA: Insufficient documentation

## 2017-07-17 DIAGNOSIS — K219 Gastro-esophageal reflux disease without esophagitis: Secondary | ICD-10-CM | POA: Insufficient documentation

## 2017-07-17 DIAGNOSIS — Z9221 Personal history of antineoplastic chemotherapy: Secondary | ICD-10-CM | POA: Insufficient documentation

## 2017-07-17 DIAGNOSIS — Z7982 Long term (current) use of aspirin: Secondary | ICD-10-CM | POA: Diagnosis not present

## 2017-07-17 DIAGNOSIS — I129 Hypertensive chronic kidney disease with stage 1 through stage 4 chronic kidney disease, or unspecified chronic kidney disease: Secondary | ICD-10-CM | POA: Diagnosis not present

## 2017-07-17 DIAGNOSIS — Z853 Personal history of malignant neoplasm of breast: Secondary | ICD-10-CM | POA: Insufficient documentation

## 2017-07-17 DIAGNOSIS — Z9011 Acquired absence of right breast and nipple: Secondary | ICD-10-CM | POA: Insufficient documentation

## 2017-07-17 LAB — CBC WITH DIFFERENTIAL/PLATELET
BASOS PCT: 1 %
Basophils Absolute: 0.1 10*3/uL (ref 0–0.1)
EOS ABS: 0.6 10*3/uL (ref 0–0.7)
Eosinophils Relative: 6 %
HCT: 36 % (ref 35.0–47.0)
HEMOGLOBIN: 11.9 g/dL — AB (ref 12.0–16.0)
Lymphocytes Relative: 41 %
Lymphs Abs: 4.1 10*3/uL — ABNORMAL HIGH (ref 1.0–3.6)
MCH: 29.6 pg (ref 26.0–34.0)
MCHC: 33 g/dL (ref 32.0–36.0)
MCV: 89.7 fL (ref 80.0–100.0)
MONOS PCT: 8 %
Monocytes Absolute: 0.8 10*3/uL (ref 0.2–0.9)
NEUTROS PCT: 44 %
Neutro Abs: 4.6 10*3/uL (ref 1.4–6.5)
Platelets: 294 10*3/uL (ref 150–440)
RBC: 4.01 MIL/uL (ref 3.80–5.20)
RDW: 14.2 % (ref 11.5–14.5)
WBC: 10.2 10*3/uL (ref 3.6–11.0)

## 2017-07-17 LAB — COMPREHENSIVE METABOLIC PANEL
ALT: 18 U/L (ref 14–54)
ANION GAP: 8 (ref 5–15)
AST: 33 U/L (ref 15–41)
Albumin: 4 g/dL (ref 3.5–5.0)
Alkaline Phosphatase: 96 U/L (ref 38–126)
BUN: 24 mg/dL — ABNORMAL HIGH (ref 6–20)
CHLORIDE: 101 mmol/L (ref 101–111)
CO2: 29 mmol/L (ref 22–32)
Calcium: 9.6 mg/dL (ref 8.9–10.3)
Creatinine, Ser: 0.91 mg/dL (ref 0.44–1.00)
GFR calc non Af Amer: 58 mL/min — ABNORMAL LOW (ref >60)
Glucose, Bld: 100 mg/dL — ABNORMAL HIGH (ref 65–99)
Potassium: 4.7 mmol/L (ref 3.5–5.1)
SODIUM: 138 mmol/L (ref 135–145)
Total Bilirubin: 0.6 mg/dL (ref 0.3–1.2)
Total Protein: 7.3 g/dL (ref 6.5–8.1)

## 2017-07-17 LAB — LACTATE DEHYDROGENASE: LDH: 147 U/L (ref 98–192)

## 2017-07-17 NOTE — Progress Notes (Signed)
Pt was sent here for lymphoma for left orbit.

## 2017-07-17 NOTE — Progress Notes (Signed)
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  INTERVAL HISTORY: Patient is an 83 year old female who recently had an excisional biopsy of a mass near her left eye. Pathology confirmed the above diagnosis. She currently feels well and is asymptomatic. She has no neurologic complaints. She has no visual complaints. She has a good appetite and denies weight loss. She denies any fevers, and night sweats, or weight loss. She has no chest pain or shortness of breath. She denies any nausea, vomiting, constipation, or diarrhea. She has no urinary complaints. Patient feels at her baseline and offers no specific complaints today.  REVIEW OF SYSTEMS:   Review of Systems  Constitutional: Negative.  Negative for diaphoresis, fever, malaise/fatigue and weight loss.  Eyes: Negative.  Negative for blurred vision, double vision and pain.  Respiratory: Negative.  Negative for cough and shortness of breath.   Cardiovascular: Negative.  Negative for chest pain and leg swelling.  Gastrointestinal: Negative.  Negative for abdominal pain.  Genitourinary: Negative.   Musculoskeletal: Negative.   Skin: Negative.  Negative for rash.  Neurological: Negative.  Negative for sensory change, focal weakness and weakness.  Psychiatric/Behavioral: Negative.  The patient is not nervous/anxious.     As per HPI. Otherwise, a complete review of systems is negative.  PAST MEDICAL HISTORY: Past Medical History:  Diagnosis Date  . Allergy   . Arrhythmia   . Arthritis   . Breast cancer ([place]) 2006   rt mastectomy  . Chronic kidney disease    chronic uti, kidney stones  . GERD (gastroesophageal reflux disease)   . Glaucoma   . HLD (hyperlipidemia)   . Hypertension   .  Marginal zone lymphoma ([place])    left orbit  . Personal history of chemotherapy   . Personal history of radiation therapy   . 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 ABDOMINAL HYSTERECTOMY    . BLADDER SUSPENSION     15 years ago  . BREAST SURGERY    . cataract surgery Bilateral   . CHOLECYSTECTOMY    . JOINT REPLACEMENT    . KNEE SURGERY Right    2016  . MASTECTOMY Right 2006   rt mast /rad/chemo  . ROTATOR CUFF REPAIR     13 years    FAMILY HISTORY: Family History  Problem Relation Age of Onset  . Breast cancer Daughter 73  . Hypertension Father   . Stroke Father   . Diabetes Sister   . Breast cancer Cousin        pat cousin  . Kidney cancer Son   . Prostate cancer Neg Hx   . Bladder Cancer Neg Hx     ADVANCED DIRECTIVES (Y/N):  N  HEALTH MAINTENANCE: Social History  Substance Use Topics  . Smoking status: Never Smoker  . Smokeless tobacco: Never Used  . Alcohol use Yes     Comment: occ     Colonoscopy:  PAP:  Bone density:  Lipid panel:  Allergies  Allergen Reactions  . Neosporin [Neomycin-Bacitracin Zn-Polymyx] Dermatitis  . Sulfa Antibiotics Itching and Rash  . Enalapril Other (See Comments)  . Oxycodone-Acetaminophen Itching    Current Outpatient Prescriptions  Medication Sig Dispense  Refill  . amLODipine (NORVASC) 2.5 MG tablet Take 2.5 mg by mouth daily.    Marland Kitchen ascorbic acid (VITAMIN C) 500 MG tablet Take 500 mg by mouth daily.    Marland Kitchen aspirin 81 MG tablet Take 81 mg by mouth daily.    Marland Kitchen azelastine (ASTELIN) 0.1 % nasal spray Place into the nose.    . clobetasol cream (TEMOVATE) 7.68 % Apply 1 application topically 2 (two) times daily as needed.     . cyanocobalamin 500 MCG tablet Take 500 mcg by mouth daily.     Marland Kitchen gabapentin (NEURONTIN) 300 MG capsule Take 300 mg by mouth. 1-2 capsules nightly as directed    . gemfibrozil (LOPID) 600 MG tablet Take  600 mg by mouth daily.    . Lactobacillus (PROBIOTIC ACIDOPHILUS PO) Take 1 Dose by mouth daily.     Marland Kitchen latanoprost (XALATAN) 0.005 % ophthalmic solution Place 1 drop into both eyes at bedtime.    Marland Kitchen losartan (COZAAR) 100 MG tablet Take 100 mg by mouth daily.    . Melatonin 10 MG TABS Take by mouth.    . mirtazapine (REMERON) 15 MG tablet Take 15 mg by mouth at bedtime.     . Multiple Vitamins-Minerals (CENTRUM SILVER PO) Take 1 each by mouth daily.    . Omega-3 Fatty Acids (FISH OIL PO) Take 500 mg by mouth daily.     Marland Kitchen omeprazole (PRILOSEC) 20 MG capsule Take 20 mg by mouth daily.    . Timolol Maleate PF 0.25 % SOLN Apply 1 drop to eye daily.    Marland Kitchen ezetimibe (ZETIA) 10 MG tablet Take 10 mg by mouth daily.     Marland Kitchen loratadine (CLARITIN) 10 MG tablet Take 10 mg by mouth daily as needed.     . magnesium gluconate (MAGONATE) 500 MG tablet Take by mouth.    . ondansetron (ZOFRAN) 4 MG tablet Take 1 tablet (4 mg total) by mouth every 6 (six) hours. (Patient not taking: Reported on 12/05/2016) 12 tablet 0   No current facility-administered medications for this visit.     OBJECTIVE: Vitals:   07/17/17 1117  BP: (!) 157/83  Pulse: 68  Resp: 18  Temp: 97.6 F (36.4 C)     Body mass index is 25.01 kg/m.    ECOG FS:0 - Asymptomatic  General: Well-developed, well-nourished, no acute distress. Eyes: Pink conjunctiva, anicteric sclera. HEENT: Normocephalic, moist mucous membranes, clear oropharnyx. Lungs: Clear to auscultation bilaterally. Heart: Regular rate and rhythm. No rubs, murmurs, or gallops. Abdomen: Soft, nontender, nondistended. No organomegaly noted, normoactive bowel sounds. Musculoskeletal: No edema, cyanosis, or clubbing. Neuro: Alert, answering all questions appropriately. Cranial nerves grossly intact. Skin: No rashes or petechiae noted. Psych: Normal affect. Lymphatics: No cervical, calvicular, axillary or inguinal LAD.   LAB RESULTS:  Lab Results  Component Value Date    NA 138 07/17/2017   K 4.7 07/17/2017   CL 101 07/17/2017   CO2 29 07/17/2017   GLUCOSE 100 (H) 07/17/2017   BUN 24 (H) 07/17/2017   CREATININE 0.91 07/17/2017   CALCIUM 9.6 07/17/2017   PROT 7.3 07/17/2017   ALBUMIN 4.0 07/17/2017   AST 33 07/17/2017   ALT 18 07/17/2017   ALKPHOS 96 07/17/2017   BILITOT 0.6 07/17/2017   GFRNONAA 58 (L) 07/17/2017   GFRAA >60 07/17/2017    Lab Results  Component Value Date   WBC 10.2 07/17/2017   NEUTROABS 4.6 07/17/2017   HGB 11.9 (L) 07/17/2017   HCT 36.0 07/17/2017  MCV 89.7 07/17/2017   PLT 294 07/17/2017     STUDIES: No results found.  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  PLAN:    1. 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 The collective findings a post-germinal center small lymphocytic infiltrate obscuring reactive germinal centers with evidence of lambda light chain restriction is characteristic of a marginal zone lymphoma. Localization of the lesion in the eyelid area is compatible with a marginal zone lymphoma of the ocular adnexa. This is a rare diagnosis representing 1-2% of all non-Hodgkin's lymphoma. We will get an MRI of the orbits as well as a PET scan to further stage patient's disease. She does not require bone marrow biopsy at this time, but would consider one if she had PET positive disease elsewhere in her body. If she only has localized disease, she may benefit from treatment with XRT. Chemotherapy would only be necessary if she has systemic disease. Return to clinic in 1-2 weeks to discuss her imaging results and treatment planning if necessary.  Approximately 60 minutes was spent in discussion of which greater than 50% was consultation.   Patient expressed understanding and was in agreement with this plan. She also understands that She can call clinic at any time with any questions, concerns, or complaints.    Cancer Staging No matching staging information was found for the patient.  Lloyd Huger, MD   07/17/2017 1:36 PM

## 2017-07-21 ENCOUNTER — Other Ambulatory Visit: Payer: Self-pay

## 2017-07-21 DIAGNOSIS — C884 Extranodal marginal zone B-cell lymphoma of mucosa-associated lymphoid tissue [MALT-lymphoma]: Secondary | ICD-10-CM | POA: Insufficient documentation

## 2017-07-21 MED ORDER — LORAZEPAM 1 MG PO TABS: 1.0000 mg | ORAL_TABLET | ORAL | 0 refills | Status: DC

## 2017-07-21 NOTE — Progress Notes (Deleted)
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  INTERVAL HISTORY: Patient is an 83 year old female who recently had an excisional biopsy of a mass near her left eye. Pathology confirmed the above diagnosis. She currently feels well and is asymptomatic. She has no neurologic complaints. She has no visual complaints. She has a good appetite and denies weight loss. She denies any fevers, and night sweats, or weight loss. She has no chest pain or shortness of breath. She denies any nausea, vomiting, constipation, or diarrhea. She has no urinary complaints. Patient feels at her baseline and offers no specific complaints today.  REVIEW OF SYSTEMS:   Review of Systems  Constitutional: Negative.  Negative for diaphoresis, fever, malaise/fatigue and weight loss.  Eyes: Negative.  Negative for blurred vision, double vision and pain.  Respiratory: Negative.  Negative for cough and shortness of breath.   Cardiovascular: Negative.  Negative for chest pain and leg swelling.  Gastrointestinal: Negative.  Negative for abdominal pain.  Genitourinary: Negative.   Musculoskeletal: Negative.   Skin: Negative.  Negative for rash.  Neurological: Negative.  Negative for sensory change, focal weakness and weakness.  Psychiatric/Behavioral: Negative.  The patient is not nervous/anxious.     As per HPI. Otherwise, a complete review of systems is negative.  PAST MEDICAL HISTORY: Past Medical History:  Diagnosis Date  . Allergy   . Arrhythmia   . Arthritis   . Breast cancer ([place]) 2006   rt mastectomy  . Chronic kidney disease    chronic uti, kidney stones  . GERD (gastroesophageal reflux disease)   . Glaucoma   . HLD (hyperlipidemia)   . Hypertension   .  Marginal zone lymphoma ([place])    left orbit  . Personal history of chemotherapy   . Personal history of radiation therapy   . 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 ABDOMINAL HYSTERECTOMY    . BLADDER SUSPENSION     15 years ago  . BREAST SURGERY    . cataract surgery Bilateral   . CHOLECYSTECTOMY    . JOINT REPLACEMENT    . KNEE SURGERY Right    2016  . MASTECTOMY Right 2006   rt mast /rad/chemo  . ROTATOR CUFF REPAIR     13 years    FAMILY HISTORY: Family History  Problem Relation Age of Onset  . Breast cancer Daughter 58  . Hypertension Father   . Stroke Father   . Diabetes Sister   . Breast cancer Cousin        pat cousin  . Kidney cancer Son   . Prostate cancer Neg Hx   . Bladder Cancer Neg Hx     ADVANCED DIRECTIVES (Y/N):  N  HEALTH MAINTENANCE: Social History  Substance Use Topics  . Smoking status: Never Smoker  . Smokeless tobacco: Never Used  . Alcohol use Yes     Comment: occ     Colonoscopy:  PAP:  Bone density:  Lipid panel:  Allergies  Allergen Reactions  . Neosporin [Neomycin-Bacitracin Zn-Polymyx] Dermatitis  . Sulfa Antibiotics Itching and Rash  . Enalapril Other (See Comments)  . Oxycodone-Acetaminophen Itching    Current Outpatient Prescriptions  Medication Sig Dispense  Refill  . amLODipine (NORVASC) 2.5 MG tablet Take 2.5 mg by mouth daily.    Marland Kitchen ascorbic acid (VITAMIN C) 500 MG tablet Take 500 mg by mouth daily.    Marland Kitchen aspirin 81 MG tablet Take 81 mg by mouth daily.    Marland Kitchen azelastine (ASTELIN) 0.1 % nasal spray Place into the nose.    . clobetasol cream (TEMOVATE) 4.46 % Apply 1 application topically 2 (two) times daily as needed.     . cyanocobalamin 500 MCG tablet Take 500 mcg by mouth daily.     Marland Kitchen ezetimibe (ZETIA) 10 MG tablet Take 10 mg by mouth daily.     Marland Kitchen gabapentin (NEURONTIN) 300 MG capsule Take 300 mg by mouth. 1-2 capsules  nightly as directed    . gemfibrozil (LOPID) 600 MG tablet Take 600 mg by mouth daily.    . Lactobacillus (PROBIOTIC ACIDOPHILUS PO) Take 1 Dose by mouth daily.     Marland Kitchen latanoprost (XALATAN) 0.005 % ophthalmic solution Place 1 drop into both eyes at bedtime.    Marland Kitchen loratadine (CLARITIN) 10 MG tablet Take 10 mg by mouth daily as needed.     Marland Kitchen losartan (COZAAR) 100 MG tablet Take 100 mg by mouth daily.    . magnesium gluconate (MAGONATE) 500 MG tablet Take by mouth.    . Melatonin 10 MG TABS Take by mouth.    . mirtazapine (REMERON) 15 MG tablet Take 15 mg by mouth at bedtime.     . Multiple Vitamins-Minerals (CENTRUM SILVER PO) Take 1 each by mouth daily.    . Omega-3 Fatty Acids (FISH OIL PO) Take 500 mg by mouth daily.     Marland Kitchen omeprazole (PRILOSEC) 20 MG capsule Take 20 mg by mouth daily.    . ondansetron (ZOFRAN) 4 MG tablet Take 1 tablet (4 mg total) by mouth every 6 (six) hours. (Patient not taking: Reported on 12/05/2016) 12 tablet 0  . Timolol Maleate PF 0.25 % SOLN Apply 1 drop to eye daily.     No current facility-administered medications for this visit.     OBJECTIVE: There were no vitals filed for this visit.   There is no height or weight on file to calculate BMI.    ECOG FS:0 - Asymptomatic  General: Well-developed, well-nourished, no acute distress. Eyes: Pink conjunctiva, anicteric sclera. HEENT: Normocephalic, moist mucous membranes, clear oropharnyx. Lungs: Clear to auscultation bilaterally. Heart: Regular rate and rhythm. No rubs, murmurs, or gallops. Abdomen: Soft, nontender, nondistended. No organomegaly noted, normoactive bowel sounds. Musculoskeletal: No edema, cyanosis, or clubbing. Neuro: Alert, answering all questions appropriately. Cranial nerves grossly intact. Skin: No rashes or petechiae noted. Psych: Normal affect. Lymphatics: No cervical, calvicular, axillary or inguinal LAD.   LAB RESULTS:  Lab Results  Component Value Date   NA 138 07/17/2017   K 4.7  07/17/2017   CL 101 07/17/2017   CO2 29 07/17/2017   GLUCOSE 100 (H) 07/17/2017   BUN 24 (H) 07/17/2017   CREATININE 0.91 07/17/2017   CALCIUM 9.6 07/17/2017   PROT 7.3 07/17/2017   ALBUMIN 4.0 07/17/2017   AST 33 07/17/2017   ALT 18 07/17/2017   ALKPHOS 96 07/17/2017   BILITOT 0.6 07/17/2017   GFRNONAA 58 (L) 07/17/2017   GFRAA >60 07/17/2017    Lab Results  Component Value Date   WBC 10.2 07/17/2017   NEUTROABS 4.6 07/17/2017   HGB 11.9 (L) 07/17/2017   HCT 36.0 07/17/2017   MCV 89.7 07/17/2017   PLT 294 07/17/2017  STUDIES: No results found.  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  PLAN:    1. 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 The collective findings a post-germinal center small lymphocytic infiltrate obscuring reactive germinal centers with evidence of lambda light chain restriction is characteristic of a marginal zone lymphoma. Localization of the lesion in the eyelid area is compatible with a marginal zone lymphoma of the ocular adnexa. This is a rare diagnosis representing 1-2% of all non-Hodgkin's lymphoma. We will get an MRI of the orbits as well as a PET scan to further stage patient's disease. She does not require bone marrow biopsy at this time, but would consider one if she had PET positive disease elsewhere in her body. If she only has localized disease, she may benefit from treatment with XRT. Chemotherapy would only be necessary if she has systemic disease. Return to clinic in 1-2 weeks to discuss her imaging results and treatment planning if necessary.  Approximately 60 minutes was spent in discussion of which greater than 50% was consultation.   Patient expressed understanding and was in agreement with this plan. She also understands that She can call clinic at any time with any questions, concerns, or complaints.   Cancer Staging No matching  staging information was found for the patient.  Lloyd Huger, MD   07/21/2017 2:56 PM

## 2017-07-23 ENCOUNTER — Ambulatory Visit: Payer: Medicare HMO

## 2017-07-23 ENCOUNTER — Encounter
Admission: RE | Admit: 2017-07-23 | Discharge: 2017-07-23 | Disposition: A | Discharge status: Home / Self Care | Payer: Medicare HMO | Source: Ambulatory Visit | Attending: Oncology | Admitting: Oncology

## 2017-07-23 ENCOUNTER — Other Ambulatory Visit: Payer: Self-pay | Admitting: Oncology

## 2017-07-23 DIAGNOSIS — C884 Extranodal marginal zone B-cell lymphoma of mucosa-associated lymphoid tissue [MALT-lymphoma]: Secondary | ICD-10-CM

## 2017-07-23 DIAGNOSIS — Z79899 Other long term (current) drug therapy: Secondary | ICD-10-CM | POA: Diagnosis not present

## 2017-07-23 DIAGNOSIS — C8331 Diffuse large B-cell lymphoma, lymph nodes of head, face, and neck: Secondary | ICD-10-CM | POA: Diagnosis not present

## 2017-07-23 LAB — GLUCOSE, CAPILLARY: GLUCOSE-CAPILLARY: 106 mg/dL — AB (ref 65–99)

## 2017-07-23 MED ORDER — FLUDEOXYGLUCOSE F - 18 (FDG) INJECTION
12.8700 | Freq: Once | INTRAVENOUS | Status: AC | PRN
  Administered 2017-07-23: 12.87 via INTRAVENOUS

## 2017-07-24 ENCOUNTER — Inpatient Hospital Stay: Payer: Medicare HMO | Admitting: Oncology

## 2017-07-29 ENCOUNTER — Ambulatory Visit
Admission: RE | Admit: 2017-07-29 | Discharge: 2017-07-29 | Disposition: A | Discharge status: Home / Self Care | Payer: Medicare HMO | Source: Ambulatory Visit | Attending: Oncology | Admitting: Oncology

## 2017-07-29 DIAGNOSIS — C884 Extranodal marginal zone B-cell lymphoma of mucosa-associated lymphoid tissue [MALT-lymphoma]: Secondary | ICD-10-CM

## 2017-07-29 DIAGNOSIS — C8599 Non-Hodgkin lymphoma, unspecified, extranodal and solid organ sites: Secondary | ICD-10-CM | POA: Diagnosis not present

## 2017-07-29 DIAGNOSIS — Z9889 Other specified postprocedural states: Secondary | ICD-10-CM | POA: Diagnosis not present

## 2017-07-29 MED ORDER — GADOBENATE DIMEGLUMINE 529 MG/ML IV SOLN
15.0000 mL | Freq: Once | INTRAVENOUS | Status: AC | PRN
  Administered 2017-07-29: 14 mL via INTRAVENOUS

## 2017-07-29 NOTE — Progress Notes (Signed)
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  INTERVAL HISTORY: Patient returns to clinic today for further evaluation and discussion of her imaging results. She continues to feel well and is asymptomatic. She has no neurologic complaints. She has no visual complaints. She has a good appetite and denies weight loss. She denies any fevers, and night sweats, or weight loss. She has no chest pain or shortness of breath. She denies any nausea, vomiting, constipation, or diarrhea. She has no urinary complaints. Patient feels at her baseline and offers no specific complaints today.  REVIEW OF SYSTEMS:   Review of Systems  Constitutional: Negative.  Negative for diaphoresis, fever, malaise/fatigue and weight loss.  Eyes: Negative.  Negative for blurred vision, double vision and pain.  Respiratory: Negative.  Negative for cough and shortness of breath.   Cardiovascular: Negative.  Negative for chest pain and leg swelling.  Gastrointestinal: Negative.  Negative for abdominal pain.  Genitourinary: Negative.   Musculoskeletal: Negative.   Skin: Negative.  Negative for rash.  Neurological: Negative.  Negative for sensory change, focal weakness and weakness.  Psychiatric/Behavioral: Negative.  The patient is not nervous/anxious.     As per HPI. Otherwise, a complete review of systems is negative.  PAST MEDICAL HISTORY: Past Medical History:  Diagnosis Date  . Allergy   . Arrhythmia   . Arthritis   . Breast cancer ([place]) 2006   rt mastectomy  . Chronic kidney disease    chronic uti, kidney stones  . GERD (gastroesophageal reflux disease)   . Glaucoma   . HLD (hyperlipidemia)   . Hypertension   . Marginal zone lymphoma ([place])    left orbit  .  Personal history of chemotherapy   . Personal history of radiation therapy   . 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 ABDOMINAL HYSTERECTOMY    . BLADDER SUSPENSION     15 years ago  . BREAST SURGERY    . cataract surgery Bilateral   . CHOLECYSTECTOMY    . JOINT REPLACEMENT    . KNEE SURGERY Right    2016  . MASTECTOMY Right 2006   rt mast /rad/chemo  . ROTATOR CUFF REPAIR     13 years    FAMILY HISTORY: Family History  Problem Relation Age of Onset  . Breast cancer Daughter 61  . Hypertension Father   . Stroke Father   . Diabetes Sister   . Breast cancer Cousin        pat cousin  . Kidney cancer Son   . Prostate cancer Neg Hx   . Bladder Cancer Neg Hx     ADVANCED DIRECTIVES (Y/N):  N  HEALTH MAINTENANCE: Social History  Substance Use Topics  . Smoking status: Never Smoker  . Smokeless tobacco: Never Used  . Alcohol use Yes     Comment: occ     Colonoscopy:  PAP:  Bone density:  Lipid panel:  Allergies  Allergen Reactions  . Neosporin [Neomycin-Bacitracin Zn-Polymyx] Dermatitis  . Sulfa Antibiotics Itching and Rash  . Enalapril Other (See Comments)  . Oxycodone-Acetaminophen Itching    Current Outpatient Prescriptions  Medication Sig Dispense Refill  . amLODipine (NORVASC) 2.5 MG tablet  Take 2.5 mg by mouth daily.    Marland Kitchen ascorbic acid (VITAMIN C) 500 MG tablet Take 500 mg by mouth daily.    Marland Kitchen aspirin 81 MG tablet Take 81 mg by mouth daily.    Marland Kitchen azelastine (ASTELIN) 0.1 % nasal spray Place into the nose.    . clobetasol cream (TEMOVATE) 5.42 % Apply 1 application topically 2 (two) times daily as needed.     . cyanocobalamin 500 MCG tablet Take 500 mcg by mouth daily.     Marland Kitchen gabapentin (NEURONTIN) 300 MG capsule Take 300 mg by mouth. 1-2 capsules nightly as directed    . gemfibrozil (LOPID) 600 MG tablet Take 600 mg by mouth daily.    . Lactobacillus  (PROBIOTIC ACIDOPHILUS PO) Take 1 Dose by mouth daily.     Marland Kitchen latanoprost (XALATAN) 0.005 % ophthalmic solution Place 1 drop into both eyes at bedtime.    Marland Kitchen loratadine (CLARITIN) 10 MG tablet Take 10 mg by mouth daily as needed.     Marland Kitchen LORazepam (ATIVAN) 1 MG tablet Take 1 tablet (1 mg total) by mouth as directed. 30 tablet 0  . losartan (COZAAR) 100 MG tablet Take 100 mg by mouth daily.    . magnesium gluconate (MAGONATE) 500 MG tablet Take by mouth.    . Melatonin 10 MG TABS Take by mouth.    . mirtazapine (REMERON) 15 MG tablet Take 15 mg by mouth at bedtime.     . Multiple Vitamins-Minerals (CENTRUM SILVER PO) Take 1 each by mouth daily.    . Omega-3 Fatty Acids (FISH OIL PO) Take 500 mg by mouth daily.     Marland Kitchen omeprazole (PRILOSEC) 20 MG capsule Take 20 mg by mouth daily.    . Timolol Maleate PF 0.25 % SOLN Apply 1 drop to eye daily.    Marland Kitchen ezetimibe (ZETIA) 10 MG tablet Take 10 mg by mouth daily.     . ondansetron (ZOFRAN) 4 MG tablet Take 1 tablet (4 mg total) by mouth every 6 (six) hours. (Patient not taking: Reported on 12/05/2016) 12 tablet 0   No current facility-administered medications for this visit.     OBJECTIVE: Vitals:   07/31/17 0953  BP: (!) 164/98  Pulse: 83  Resp: 16  Temp: 98.5 F (36.9 C)     Body mass index is 25.19 kg/m.    ECOG FS:0 - Asymptomatic  General: Well-developed, well-nourished, no acute distress. Eyes: Pink conjunctiva, anicteric sclera. HEENT: Normocephalic, moist mucous membranes, clear oropharnyx. Lungs: Clear to auscultation bilaterally. Heart: Regular rate and rhythm. No rubs, murmurs, or gallops. Abdomen: Soft, nontender, nondistended. No organomegaly noted, normoactive bowel sounds. Musculoskeletal: No edema, cyanosis, or clubbing. Neuro: Alert, answering all questions appropriately. Cranial nerves grossly intact. Skin: No rashes or petechiae noted. Psych: Normal affect. Lymphatics: No cervical, calvicular, axillary or inguinal  LAD.   LAB RESULTS:  Lab Results  Component Value Date   NA 138 07/17/2017   K 4.7 07/17/2017   CL 101 07/17/2017   CO2 29 07/17/2017   GLUCOSE 100 (H) 07/17/2017   BUN 24 (H) 07/17/2017   CREATININE 0.91 07/17/2017   CALCIUM 9.6 07/17/2017   PROT 7.3 07/17/2017   ALBUMIN 4.0 07/17/2017   AST 33 07/17/2017   ALT 18 07/17/2017   ALKPHOS 96 07/17/2017   BILITOT 0.6 07/17/2017   GFRNONAA 58 (L) 07/17/2017   GFRAA >60 07/17/2017    Lab Results  Component Value Date   WBC 10.2 07/17/2017   NEUTROABS 4.6 07/17/2017  HGB 11.9 (L) 07/17/2017   HCT 36.0 07/17/2017   MCV 89.7 07/17/2017   PLT 294 07/17/2017     STUDIES: Nm Pet Image Initial (pi) Skull Base To Thigh  Result Date: 07/23/2017 CLINICAL DATA:  Initial treatment strategy for B-cell lymphoma revealed on excision of a mass near the left eyelid. History of right breast cancer. EXAM: NUCLEAR MEDICINE PET SKULL BASE TO THIGH TECHNIQUE: 12.9 mCi F-18 FDG was injected intravenously. Full-ring PET imaging was performed from the skull base to thigh after the radiotracer. CT data was obtained and used for attenuation correction and anatomic localization. FASTING BLOOD GLUCOSE:  Value: 106 mg/dl COMPARISON:  Multiple exams, including CT abdomen dated 08/10/2015 FINDINGS: NECK No obvious abnormal intraorbital activity. No hypermetabolic or pathologically enlarged lymph nodes in the neck. CHEST In indistinctly marginated right hilar lymph node has a maximum SUV of 5.4. A right lower paratracheal lymph node measuring 0.8 cm in short axis on image 76/ 3 has a maximum standard uptake value of 3.0. Background mediastinal blood pool activity 2.7. Right axillary clips noted. Scatter mosaic attenuation in the lungs. Volume loss and scarring in the right upper lobe with some associated calcifications. Peripheral interstitial accentuation in the right upper lobe anteriorly is likely due to prior radiation therapy. Atherosclerotic calcification of  the aortic arch is present. Mildly elevated right hemidiaphragm. Mild cardiomegaly. Right mastectomy. ABDOMEN/PELVIS No abnormal hypermetabolic activity within the liver, pancreas, adrenal glands, or spleen. No hypermetabolic lymph nodes in the abdomen or pelvis. A right external iliac node measuring 1.2 cm in short axis on image 220/3 has a maximum SUV of 2.2. A left external iliac node measuring 1.2 cm in short axis on image 215/3 has a maximum SUV of 2.3. For reference purposes, background hepatic uptake SUV is 3.7. Accentuated physiologic activity in the colon, most notably the ascending colon. Fatty prominence of the ileocecal valve. Scattered colonic diverticula with sigmoid colon diverticulosis. Aortoiliac atherosclerotic vascular disease.  Cholecystectomy. SKELETON No focal hypermetabolic activity to suggest skeletal metastasis. Levoconvex lumbar scoliosis with grade 1 degenerative retrolisthesis at L2-3 and L3-4. IMPRESSION: 1. A small indistinctly marginated right hilar lymph node has a maximum standard uptake value of 5.4, mildly accentuated, and potentially representing Deauville 4 disease. There are mildly enlarged bilateral external iliac lymph nodes with metabolic activity less than that of the blood pool, Deauville 2. No specific abnormal activity in the vicinity of the orbits. 2. Scattered mosaic attenuation in the lungs, query air trapping. 3. Scarring and volume loss in the right upper lobe with some associated calcifications, and with evidence of prior radiation therapy to the right breast. Right mastectomy. 4. Mildly elevated right hemidiaphragm. 5. Other imaging findings of potential clinical significance: Aortic Atherosclerosis (ICD10-I70.0). Mild cardiomegaly. Levoconvex lumbar scoliosis with grade 1 degenerative retrolisthesis at L2-3 and L3-4. Electronically Signed   By: Van Clines M.D.   On: 07/23/2017 15:27   Mr Rosealee Albee IW Contrast  Result Date: 07/29/2017 CLINICAL DATA:   Recent excision of B-cell lymphoma of the left eye lid. EXAM: MRI OF THE ORBITS WITHOUT AND WITH CONTRAST TECHNIQUE: Multiplanar, multisequence MR imaging of the orbits was performed both before and after the administration of intravenous contrast. CONTRAST:  10m MULTIHANCE GADOBENATE DIMEGLUMINE 529 MG/ML IV SOLN COMPARISON:  PET scan 07/23/2017 FINDINGS: Nonspecific postsurgical changes of the subcutaneous fatty tissues in the left supraorbital region. The appearance of this is consistent with the recent biopsy. I cannot determine whether there could be residual tumor in this region,  as the findings are consistent with postoperative change. Both globes appear normal. Both optic nerves are normal. Extra-ocular muscles are normal. Lacrimal glands are normal. Orbital fat is normal. Orbital apices are normal. Cavernous sinus regions are normal. No evidence of intra-axial brain lesion in the region studied. There is an incidental meningioma of the right frontal lateral convexity measuring 16 mm in diameter, slightly indenting the brain but without significant mass effect or evidence of edema. IMPRESSION: Postsurgical changes in the left supraorbital scalp region. The appearance would be entirely consistent with benign postoperative changes, but I cannot exclude the possibility of some residual or second focus of lymphoma within the surgical region. Orbits are otherwise normal. Incidental 16 mm meningioma at the right frontal lateral convexity. Electronically Signed   By: Nelson Chimes M.D.   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  PLAN:    1. 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 The collective findings a post-germinal center small lymphocytic infiltrate obscuring reactive germinal centers with evidence of lambda light chain restriction is characteristic of a marginal zone lymphoma.  Localization of the lesion in the eyelid area is compatible with a marginal zone lymphoma of the ocular adnexa. This is a rare diagnosis representing 1-2% of all non-Hodgkin's lymphoma. MRI of the orbits and PET scan results reviewed independently and reported as above. Patient noted to have a small right hilar lymph node with an SUV uptake of 5.4 of unclear etiology. She does not require bone marrow biopsy at this time, but would consider one if she had definitive evidence of systemic disease. A referral was given to radiation oncology today for XRT for consideration of local control of disease. She does not require systemic therapy at this time. Given the right hilar lymph node seen on PET scan, will proceed with a repeat PET scan in 3 months to assess for interval change. If this is unchanged, patient likely can be monitored with imaging every 6 months.   Approximately 30 minutes was spent in discussion of which greater than 50% was consultation.   Patient expressed understanding and was in agreement with this plan. She also understands that She can call clinic at any time with any questions, concerns, or complaints.   Cancer Staging Extranodal marginal zone B-cell lymphoma (HCC) Staging form: Hodgkin and Non-Hodgkin Lymphoma, AJCC 8th Edition - Clinical stage from 07/31/2017: Stage IE (Marginal zone lymphoma) - Signed by Lloyd Huger, MD on 07/31/2017   Lloyd Huger, MD   07/31/2017 12:25 PM

## 2017-07-31 ENCOUNTER — Inpatient Hospital Stay: Payer: Medicare HMO | Attending: Oncology | Admitting: Oncology

## 2017-07-31 ENCOUNTER — Other Ambulatory Visit: Payer: Self-pay | Admitting: *Deleted

## 2017-07-31 VITALS — BP 164/98 | HR 83 | Temp 98.5°F | Resp 16 | Wt 156.1 lb

## 2017-07-31 DIAGNOSIS — Z9011 Acquired absence of right breast and nipple: Secondary | ICD-10-CM | POA: Diagnosis not present

## 2017-07-31 DIAGNOSIS — I129 Hypertensive chronic kidney disease with stage 1 through stage 4 chronic kidney disease, or unspecified chronic kidney disease: Secondary | ICD-10-CM | POA: Insufficient documentation

## 2017-07-31 DIAGNOSIS — Z7982 Long term (current) use of aspirin: Secondary | ICD-10-CM | POA: Diagnosis not present

## 2017-07-31 DIAGNOSIS — N189 Chronic kidney disease, unspecified: Secondary | ICD-10-CM | POA: Diagnosis not present

## 2017-07-31 DIAGNOSIS — I517 Cardiomegaly: Secondary | ICD-10-CM | POA: Diagnosis not present

## 2017-07-31 DIAGNOSIS — C884 Extranodal marginal zone B-cell lymphoma of mucosa-associated lymphoid tissue [MALT-lymphoma]: Secondary | ICD-10-CM

## 2017-07-31 DIAGNOSIS — K219 Gastro-esophageal reflux disease without esophagitis: Secondary | ICD-10-CM | POA: Diagnosis not present

## 2017-07-31 DIAGNOSIS — M4316 Spondylolisthesis, lumbar region: Secondary | ICD-10-CM | POA: Diagnosis not present

## 2017-07-31 DIAGNOSIS — Z79899 Other long term (current) drug therapy: Secondary | ICD-10-CM | POA: Diagnosis not present

## 2017-07-31 DIAGNOSIS — Z923 Personal history of irradiation: Secondary | ICD-10-CM | POA: Diagnosis not present

## 2017-07-31 DIAGNOSIS — Z853 Personal history of malignant neoplasm of breast: Secondary | ICD-10-CM

## 2017-07-31 DIAGNOSIS — H409 Unspecified glaucoma: Secondary | ICD-10-CM | POA: Diagnosis not present

## 2017-07-31 DIAGNOSIS — Z87442 Personal history of urinary calculi: Secondary | ICD-10-CM | POA: Insufficient documentation

## 2017-07-31 DIAGNOSIS — E785 Hyperlipidemia, unspecified: Secondary | ICD-10-CM | POA: Diagnosis not present

## 2017-07-31 DIAGNOSIS — M5136 Other intervertebral disc degeneration, lumbar region: Secondary | ICD-10-CM | POA: Insufficient documentation

## 2017-07-31 NOTE — Progress Notes (Signed)
Patient her to discuss scan results and discuss treatment plan.

## 2017-08-07 ENCOUNTER — Institutional Professional Consult (permissible substitution): Payer: Medicare HMO | Admitting: Radiation Oncology

## 2017-08-10 ENCOUNTER — Encounter: Payer: Self-pay | Admitting: Dermatology

## 2017-08-11 ENCOUNTER — Encounter: Payer: Self-pay | Admitting: Radiation Oncology

## 2017-08-11 ENCOUNTER — Ambulatory Visit: Payer: Medicare HMO

## 2017-08-11 ENCOUNTER — Ambulatory Visit
Admission: RE | Admit: 2017-08-11 | Discharge: 2017-08-11 | Disposition: A | Discharge status: Home / Self Care | Payer: Medicare HMO | Source: Ambulatory Visit | Attending: Radiation Oncology | Admitting: Radiation Oncology

## 2017-08-11 VITALS — BP 155/94 | HR 102 | Temp 98.0°F | Wt 153.7 lb

## 2017-08-11 DIAGNOSIS — I129 Hypertensive chronic kidney disease with stage 1 through stage 4 chronic kidney disease, or unspecified chronic kidney disease: Secondary | ICD-10-CM | POA: Insufficient documentation

## 2017-08-11 DIAGNOSIS — K219 Gastro-esophageal reflux disease without esophagitis: Secondary | ICD-10-CM | POA: Diagnosis not present

## 2017-08-11 DIAGNOSIS — Z923 Personal history of irradiation: Secondary | ICD-10-CM | POA: Insufficient documentation

## 2017-08-11 DIAGNOSIS — E785 Hyperlipidemia, unspecified: Secondary | ICD-10-CM | POA: Insufficient documentation

## 2017-08-11 DIAGNOSIS — Z803 Family history of malignant neoplasm of breast: Secondary | ICD-10-CM | POA: Diagnosis not present

## 2017-08-11 DIAGNOSIS — Z510 Encounter for antineoplastic radiation therapy: Secondary | ICD-10-CM | POA: Insufficient documentation

## 2017-08-11 DIAGNOSIS — Z853 Personal history of malignant neoplasm of breast: Secondary | ICD-10-CM | POA: Diagnosis not present

## 2017-08-11 DIAGNOSIS — Z79899 Other long term (current) drug therapy: Secondary | ICD-10-CM | POA: Diagnosis not present

## 2017-08-11 DIAGNOSIS — Z85828 Personal history of other malignant neoplasm of skin: Secondary | ICD-10-CM | POA: Diagnosis not present

## 2017-08-11 DIAGNOSIS — Z9011 Acquired absence of right breast and nipple: Secondary | ICD-10-CM | POA: Diagnosis not present

## 2017-08-11 DIAGNOSIS — N189 Chronic kidney disease, unspecified: Secondary | ICD-10-CM | POA: Diagnosis not present

## 2017-08-11 DIAGNOSIS — Z9221 Personal history of antineoplastic chemotherapy: Secondary | ICD-10-CM | POA: Insufficient documentation

## 2017-08-11 DIAGNOSIS — C884 Extranodal marginal zone B-cell lymphoma of mucosa-associated lymphoid tissue [MALT-lymphoma]: Secondary | ICD-10-CM | POA: Diagnosis not present

## 2017-08-11 DIAGNOSIS — Z8051 Family history of malignant neoplasm of kidney: Secondary | ICD-10-CM | POA: Diagnosis not present

## 2017-08-11 DIAGNOSIS — Z7982 Long term (current) use of aspirin: Secondary | ICD-10-CM | POA: Diagnosis not present

## 2017-08-11 NOTE — Consult Note (Signed)
NEW PATIENT EVALUATION  Name: Sandy Hickman  MRN: [id number]  Date:   08/11/2017     DOB: [birth year]   This 83 y.o. female patient presents to the clinic for initial evaluation of left supraorbital extranodal marginal zone lymphoma.  REFERRING PHYSICIAN: Ezequiel Kayser, MD  CHIEF COMPLAINT:  Chief Complaint  Patient presents with  . 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   PREVIOUS INVESTIGATIONS:  MRI scan PET CT scan reviewed Pathology reports reviewed Clinical notes reviewed  HPI: Patient is a pleasant 83 year old female status post right modified radical mastectomy with adjuvant chemotherapy and radiation back in 2006 for invasive mammary carcinoma. She has developed a nodular density above her left orbital ridge and an area previously excised for basal cell carcinoma. She had no B type symptoms no fever chills or night sweats. She has no visual complaints. She underwent excisional biopsy showing extranodal marginal zone lymphoma of the ocular and adnexa. Tumor was proximity 2 cm subcutaneous nodule. The tumor was a low-grade B-cell lymphoma. Postoperative MRI scans of post surgical change in the left supraorbital scalp. PET CT scan was performed showing no real evidence of hypermetabolic activity in that region. Interesting she did have a right hilar lymph node with hypermetabolic activity that will be followed with repeat scans in 3-4 months. Currently she is asymptomatic and doing well. She seen today for radiation oncology opinion.  PLANNED TREATMENT REGIMEN: Electron beam therapy  PAST MEDICAL HISTORY:  has a past medical history of Allergy; Arrhythmia; Arthritis; Breast cancer ([place]) (2006); Chronic kidney disease; GERD (gastroesophageal reflux disease); Glaucoma; HLD (hyperlipidemia); Hypertension; Marginal zone lymphoma ([place]); Personal history of chemotherapy; Personal history of  radiation therapy; and Skin cancer.    PAST SURGICAL HISTORY:  Past Surgical History:  Procedure Laterality Date  . ABDOMINAL HYSTERECTOMY    . BLADDER SUSPENSION     15 years ago  . BREAST SURGERY    . cataract surgery Bilateral   . CHOLECYSTECTOMY    . JOINT REPLACEMENT    . KNEE SURGERY Right    2016  . MASTECTOMY Right 2006   rt mast /rad/chemo  . ROTATOR CUFF REPAIR     13 years    FAMILY HISTORY: family history includes Breast cancer in her cousin; Breast cancer (age of onset: 27) in her daughter; Diabetes in her sister; Hypertension in her father; Kidney cancer in her son; Stroke in her father.  SOCIAL HISTORY:  reports that she has never smoked. She has never used smokeless tobacco. She reports that she drinks alcohol. She reports that she does not use drugs.  ALLERGIES: Neosporin [neomycin-bacitracin zn-polymyx]; Sulfa antibiotics; Enalapril; and Oxycodone-acetaminophen  MEDICATIONS:  Current Outpatient Prescriptions  Medication Sig Dispense Refill  . amLODipine (NORVASC) 2.5 MG tablet Take 2.5 mg by mouth daily.    Marland Kitchen ascorbic acid (VITAMIN C) 500 MG tablet Take 500 mg by mouth daily.    Marland Kitchen aspirin 81 MG tablet Take 81 mg by mouth daily.    Marland Kitchen azelastine (ASTELIN) 0.1 % nasal spray Place into the nose.    . clobetasol cream (TEMOVATE) 7.61 % Apply 1 application topically 2 (two) times daily as needed.     . cyanocobalamin 500 MCG tablet Take 500 mcg by mouth daily.     Marland Kitchen ezetimibe (ZETIA) 10 MG tablet Take 10 mg by mouth daily.     Marland Kitchen gabapentin (NEURONTIN) 300 MG capsule Take 300 mg  by mouth. 1-2 capsules nightly as directed    . gemfibrozil (LOPID) 600 MG tablet Take 600 mg by mouth daily.    . Lactobacillus (PROBIOTIC ACIDOPHILUS PO) Take 1 Dose by mouth daily.     Marland Kitchen latanoprost (XALATAN) 0.005 % ophthalmic solution Place 1 drop into both eyes at bedtime.    Marland Kitchen loratadine (CLARITIN) 10 MG tablet Take 10 mg by mouth daily as needed.     Marland Kitchen LORazepam (ATIVAN) 1 MG tablet  Take 1 tablet (1 mg total) by mouth as directed. 30 tablet 0  . losartan (COZAAR) 100 MG tablet Take 100 mg by mouth daily.    . magnesium gluconate (MAGONATE) 500 MG tablet Take by mouth.    . Melatonin 10 MG TABS Take by mouth.    . mirtazapine (REMERON) 15 MG tablet Take 15 mg by mouth at bedtime.     . Multiple Vitamins-Minerals (CENTRUM SILVER PO) Take 1 each by mouth daily.    . Omega-3 Fatty Acids (FISH OIL PO) Take 500 mg by mouth daily.     Marland Kitchen omeprazole (PRILOSEC) 20 MG capsule Take 20 mg by mouth daily.    . ondansetron (ZOFRAN) 4 MG tablet Take 1 tablet (4 mg total) by mouth every 6 (six) hours. (Patient not taking: Reported on 12/05/2016) 12 tablet 0  . Timolol Maleate PF 0.25 % SOLN Apply 1 drop to eye daily.     No current facility-administered medications for this encounter.     ECOG PERFORMANCE STATUS:  0 - Asymptomatic  REVIEW OF SYSTEMS:  Patient denies any weight loss, fatigue, weakness, fever, chills or night sweats. Patient denies any loss of vision, blurred vision. Patient denies any ringing  of the ears or hearing loss. No irregular heartbeat. Patient denies heart murmur or history of fainting. Patient denies any chest pain or pain radiating to her upper extremities. Patient denies any shortness of breath, difficulty breathing at night, cough or hemoptysis. Patient denies any swelling in the lower legs. Patient denies any nausea vomiting, vomiting of blood, or coffee ground material in the vomitus. Patient denies any stomach pain. Patient states has had normal bowel movements no significant constipation or diarrhea. Patient denies any dysuria, hematuria or significant nocturia. Patient denies any problems walking, swelling in the joints or loss of balance. Patient denies any skin changes, loss of hair or loss of weight. Patient denies any excessive worrying or anxiety or significant depression. Patient denies any problems with insomnia. Patient denies excessive thirst,  polyuria, polydipsia. Patient denies any swollen glands, patient denies easy bruising or easy bleeding. Patient denies any recent infections, allergies or URI. Patient "s visual fields have not changed significantly in recent time.    PHYSICAL EXAM: BP (!) 155/94   Pulse (!) 102   Temp 98 F (36.7 C)   Wt 153 lb 10.6 oz (69.7 kg)   BMI 24.80 kg/m  There is a firm nodular density above the left supraorbital ridge. No evidence of preauricular sub-digastric cervical or supraclavicular adenopathy is identified. Well-developed well-nourished patient in NAD. HEENT reveals PERLA, EOMI, discs not visualized.  Oral cavity is clear. No oral mucosal lesions are identified. Neck is clear without evidence of cervical or supraclavicular adenopathy. Lungs are clear to A&P. Cardiac examination is essentially unremarkable with regular rate and rhythm without murmur rub or thrill. Abdomen is benign with no organomegaly or masses noted. Motor sensory and DTR levels are equal and symmetric in the upper and lower extremities. Cranial nerves II through  XII are grossly intact. Proprioception is intact. No peripheral adenopathy or edema is identified. No motor or sensory levels are noted. Crude visual fields are within normal range.  LABORATORY DATA: Pathology reports reviewed    RADIOLOGY RESULTS: MRI scans and PET CT scans all reviewed and compatible with the above-stated findings   IMPRESSION: Extranodal marginal zone lymphoma of ocular adnexa status post biopsy in this 83 year old female  PLAN: At this time I to go ahead with electron beam therapy which was the recommended treatment choice for this type of lymphoma. Lesions very radiosensitive. I would plan on delivering 3000 cGy in 15 fractions using electron beam to the area of primary tumor involvement sparing her orbit. I first set up and ordered CT simulation for early next week. Risks and benefits of treatment including skin reaction slight fatigue permanent  skin changes all were discussed in detail with the patient. She seems to comprehend my treatment plan well.  I would like to take this opportunity to thank you for allowing me to participate in the care of your patient.Armstead Peaks., MD

## 2017-08-18 ENCOUNTER — Ambulatory Visit
Admission: RE | Admit: 2017-08-18 | Discharge: 2017-08-18 | Disposition: A | Discharge status: Home / Self Care | Payer: Medicare HMO | Source: Ambulatory Visit | Attending: Radiation Oncology | Admitting: Radiation Oncology

## 2017-08-18 DIAGNOSIS — Z853 Personal history of malignant neoplasm of breast: Secondary | ICD-10-CM | POA: Diagnosis not present

## 2017-08-18 DIAGNOSIS — Z510 Encounter for antineoplastic radiation therapy: Secondary | ICD-10-CM | POA: Diagnosis not present

## 2017-08-18 DIAGNOSIS — Z9011 Acquired absence of right breast and nipple: Secondary | ICD-10-CM | POA: Diagnosis not present

## 2017-08-18 DIAGNOSIS — Z79899 Other long term (current) drug therapy: Secondary | ICD-10-CM | POA: Diagnosis not present

## 2017-08-18 DIAGNOSIS — Z85828 Personal history of other malignant neoplasm of skin: Secondary | ICD-10-CM | POA: Diagnosis not present

## 2017-08-18 DIAGNOSIS — K219 Gastro-esophageal reflux disease without esophagitis: Secondary | ICD-10-CM | POA: Diagnosis not present

## 2017-08-18 DIAGNOSIS — I129 Hypertensive chronic kidney disease with stage 1 through stage 4 chronic kidney disease, or unspecified chronic kidney disease: Secondary | ICD-10-CM | POA: Diagnosis not present

## 2017-08-18 DIAGNOSIS — C884 Extranodal marginal zone B-cell lymphoma of mucosa-associated lymphoid tissue [MALT-lymphoma]: Secondary | ICD-10-CM | POA: Diagnosis not present

## 2017-08-18 DIAGNOSIS — N189 Chronic kidney disease, unspecified: Secondary | ICD-10-CM | POA: Diagnosis not present

## 2017-08-18 DIAGNOSIS — E785 Hyperlipidemia, unspecified: Secondary | ICD-10-CM | POA: Diagnosis not present

## 2017-08-20 DIAGNOSIS — A499 Bacterial infection, unspecified: Secondary | ICD-10-CM | POA: Diagnosis not present

## 2017-08-20 DIAGNOSIS — Z23 Encounter for immunization: Secondary | ICD-10-CM | POA: Diagnosis not present

## 2017-08-20 DIAGNOSIS — R300 Dysuria: Secondary | ICD-10-CM | POA: Diagnosis not present

## 2017-08-20 DIAGNOSIS — N390 Urinary tract infection, site not specified: Secondary | ICD-10-CM | POA: Diagnosis not present

## 2017-08-24 ENCOUNTER — Ambulatory Visit
Admission: RE | Admit: 2017-08-24 | Discharge: 2017-08-24 | Disposition: A | Discharge status: Home / Self Care | Payer: Medicare HMO | Source: Ambulatory Visit | Attending: Radiation Oncology | Admitting: Radiation Oncology

## 2017-08-24 DIAGNOSIS — E785 Hyperlipidemia, unspecified: Secondary | ICD-10-CM | POA: Diagnosis not present

## 2017-08-24 DIAGNOSIS — Z79899 Other long term (current) drug therapy: Secondary | ICD-10-CM | POA: Diagnosis not present

## 2017-08-24 DIAGNOSIS — Z510 Encounter for antineoplastic radiation therapy: Secondary | ICD-10-CM | POA: Diagnosis not present

## 2017-08-24 DIAGNOSIS — C884 Extranodal marginal zone B-cell lymphoma of mucosa-associated lymphoid tissue [MALT-lymphoma]: Secondary | ICD-10-CM | POA: Diagnosis not present

## 2017-08-24 DIAGNOSIS — Z853 Personal history of malignant neoplasm of breast: Secondary | ICD-10-CM | POA: Diagnosis not present

## 2017-08-24 DIAGNOSIS — K219 Gastro-esophageal reflux disease without esophagitis: Secondary | ICD-10-CM | POA: Diagnosis not present

## 2017-08-24 DIAGNOSIS — Z9011 Acquired absence of right breast and nipple: Secondary | ICD-10-CM | POA: Diagnosis not present

## 2017-08-24 DIAGNOSIS — Z85828 Personal history of other malignant neoplasm of skin: Secondary | ICD-10-CM | POA: Diagnosis not present

## 2017-08-24 DIAGNOSIS — N189 Chronic kidney disease, unspecified: Secondary | ICD-10-CM | POA: Diagnosis not present

## 2017-08-24 DIAGNOSIS — I129 Hypertensive chronic kidney disease with stage 1 through stage 4 chronic kidney disease, or unspecified chronic kidney disease: Secondary | ICD-10-CM | POA: Diagnosis not present

## 2017-08-25 ENCOUNTER — Ambulatory Visit
Admission: RE | Admit: 2017-08-25 | Discharge: 2017-08-25 | Disposition: A | Discharge status: Home / Self Care | Payer: Medicare HMO | Source: Ambulatory Visit | Attending: Radiation Oncology | Admitting: Radiation Oncology

## 2017-08-25 DIAGNOSIS — Z85828 Personal history of other malignant neoplasm of skin: Secondary | ICD-10-CM | POA: Diagnosis not present

## 2017-08-25 DIAGNOSIS — Z853 Personal history of malignant neoplasm of breast: Secondary | ICD-10-CM | POA: Diagnosis not present

## 2017-08-25 DIAGNOSIS — C884 Extranodal marginal zone B-cell lymphoma of mucosa-associated lymphoid tissue [MALT-lymphoma]: Secondary | ICD-10-CM | POA: Diagnosis not present

## 2017-08-25 DIAGNOSIS — Z79899 Other long term (current) drug therapy: Secondary | ICD-10-CM | POA: Diagnosis not present

## 2017-08-25 DIAGNOSIS — E785 Hyperlipidemia, unspecified: Secondary | ICD-10-CM | POA: Diagnosis not present

## 2017-08-25 DIAGNOSIS — K219 Gastro-esophageal reflux disease without esophagitis: Secondary | ICD-10-CM | POA: Diagnosis not present

## 2017-08-25 DIAGNOSIS — Z9011 Acquired absence of right breast and nipple: Secondary | ICD-10-CM | POA: Diagnosis not present

## 2017-08-25 DIAGNOSIS — N189 Chronic kidney disease, unspecified: Secondary | ICD-10-CM | POA: Diagnosis not present

## 2017-08-25 DIAGNOSIS — I129 Hypertensive chronic kidney disease with stage 1 through stage 4 chronic kidney disease, or unspecified chronic kidney disease: Secondary | ICD-10-CM | POA: Diagnosis not present

## 2017-08-25 DIAGNOSIS — Z510 Encounter for antineoplastic radiation therapy: Secondary | ICD-10-CM | POA: Diagnosis not present

## 2017-08-26 ENCOUNTER — Ambulatory Visit
Admission: RE | Admit: 2017-08-26 | Discharge: 2017-08-26 | Disposition: A | Discharge status: Home / Self Care | Payer: Medicare HMO | Source: Ambulatory Visit | Attending: Radiation Oncology | Admitting: Radiation Oncology

## 2017-08-26 DIAGNOSIS — K219 Gastro-esophageal reflux disease without esophagitis: Secondary | ICD-10-CM | POA: Diagnosis not present

## 2017-08-26 DIAGNOSIS — I129 Hypertensive chronic kidney disease with stage 1 through stage 4 chronic kidney disease, or unspecified chronic kidney disease: Secondary | ICD-10-CM | POA: Diagnosis not present

## 2017-08-26 DIAGNOSIS — Z79899 Other long term (current) drug therapy: Secondary | ICD-10-CM | POA: Diagnosis not present

## 2017-08-26 DIAGNOSIS — C884 Extranodal marginal zone B-cell lymphoma of mucosa-associated lymphoid tissue [MALT-lymphoma]: Secondary | ICD-10-CM | POA: Diagnosis not present

## 2017-08-26 DIAGNOSIS — Z9011 Acquired absence of right breast and nipple: Secondary | ICD-10-CM | POA: Diagnosis not present

## 2017-08-26 DIAGNOSIS — E785 Hyperlipidemia, unspecified: Secondary | ICD-10-CM | POA: Diagnosis not present

## 2017-08-26 DIAGNOSIS — Z510 Encounter for antineoplastic radiation therapy: Secondary | ICD-10-CM | POA: Diagnosis not present

## 2017-08-26 DIAGNOSIS — Z85828 Personal history of other malignant neoplasm of skin: Secondary | ICD-10-CM | POA: Diagnosis not present

## 2017-08-26 DIAGNOSIS — Z853 Personal history of malignant neoplasm of breast: Secondary | ICD-10-CM | POA: Diagnosis not present

## 2017-08-26 DIAGNOSIS — N189 Chronic kidney disease, unspecified: Secondary | ICD-10-CM | POA: Diagnosis not present

## 2017-08-27 ENCOUNTER — Ambulatory Visit
Admission: RE | Admit: 2017-08-27 | Discharge: 2017-08-27 | Disposition: A | Discharge status: Home / Self Care | Payer: Medicare HMO | Source: Ambulatory Visit | Attending: Radiation Oncology | Admitting: Radiation Oncology

## 2017-08-27 DIAGNOSIS — Z9011 Acquired absence of right breast and nipple: Secondary | ICD-10-CM | POA: Diagnosis not present

## 2017-08-27 DIAGNOSIS — Z510 Encounter for antineoplastic radiation therapy: Secondary | ICD-10-CM | POA: Diagnosis not present

## 2017-08-27 DIAGNOSIS — Z79899 Other long term (current) drug therapy: Secondary | ICD-10-CM | POA: Diagnosis not present

## 2017-08-27 DIAGNOSIS — N189 Chronic kidney disease, unspecified: Secondary | ICD-10-CM | POA: Diagnosis not present

## 2017-08-27 DIAGNOSIS — Z853 Personal history of malignant neoplasm of breast: Secondary | ICD-10-CM | POA: Diagnosis not present

## 2017-08-27 DIAGNOSIS — Z85828 Personal history of other malignant neoplasm of skin: Secondary | ICD-10-CM | POA: Diagnosis not present

## 2017-08-27 DIAGNOSIS — C884 Extranodal marginal zone B-cell lymphoma of mucosa-associated lymphoid tissue [MALT-lymphoma]: Secondary | ICD-10-CM | POA: Diagnosis not present

## 2017-08-27 DIAGNOSIS — E785 Hyperlipidemia, unspecified: Secondary | ICD-10-CM | POA: Diagnosis not present

## 2017-08-27 DIAGNOSIS — I129 Hypertensive chronic kidney disease with stage 1 through stage 4 chronic kidney disease, or unspecified chronic kidney disease: Secondary | ICD-10-CM | POA: Diagnosis not present

## 2017-08-27 DIAGNOSIS — K219 Gastro-esophageal reflux disease without esophagitis: Secondary | ICD-10-CM | POA: Diagnosis not present

## 2017-08-28 ENCOUNTER — Ambulatory Visit
Admission: RE | Admit: 2017-08-28 | Discharge: 2017-08-28 | Disposition: A | Discharge status: Home / Self Care | Payer: Medicare HMO | Source: Ambulatory Visit | Attending: Radiation Oncology | Admitting: Radiation Oncology

## 2017-08-28 DIAGNOSIS — N189 Chronic kidney disease, unspecified: Secondary | ICD-10-CM | POA: Diagnosis not present

## 2017-08-28 DIAGNOSIS — I129 Hypertensive chronic kidney disease with stage 1 through stage 4 chronic kidney disease, or unspecified chronic kidney disease: Secondary | ICD-10-CM | POA: Diagnosis not present

## 2017-08-28 DIAGNOSIS — Z79899 Other long term (current) drug therapy: Secondary | ICD-10-CM | POA: Diagnosis not present

## 2017-08-28 DIAGNOSIS — C884 Extranodal marginal zone B-cell lymphoma of mucosa-associated lymphoid tissue [MALT-lymphoma]: Secondary | ICD-10-CM | POA: Diagnosis not present

## 2017-08-28 DIAGNOSIS — Z853 Personal history of malignant neoplasm of breast: Secondary | ICD-10-CM | POA: Diagnosis not present

## 2017-08-28 DIAGNOSIS — Z9011 Acquired absence of right breast and nipple: Secondary | ICD-10-CM | POA: Diagnosis not present

## 2017-08-28 DIAGNOSIS — Z85828 Personal history of other malignant neoplasm of skin: Secondary | ICD-10-CM | POA: Diagnosis not present

## 2017-08-28 DIAGNOSIS — Z510 Encounter for antineoplastic radiation therapy: Secondary | ICD-10-CM | POA: Diagnosis not present

## 2017-08-28 DIAGNOSIS — E785 Hyperlipidemia, unspecified: Secondary | ICD-10-CM | POA: Diagnosis not present

## 2017-08-28 DIAGNOSIS — K219 Gastro-esophageal reflux disease without esophagitis: Secondary | ICD-10-CM | POA: Diagnosis not present

## 2017-08-31 ENCOUNTER — Ambulatory Visit
Admission: RE | Admit: 2017-08-31 | Discharge: 2017-08-31 | Disposition: A | Discharge status: Home / Self Care | Payer: Medicare HMO | Source: Ambulatory Visit | Attending: Radiation Oncology | Admitting: Radiation Oncology

## 2017-08-31 DIAGNOSIS — Z9011 Acquired absence of right breast and nipple: Secondary | ICD-10-CM | POA: Diagnosis not present

## 2017-08-31 DIAGNOSIS — I129 Hypertensive chronic kidney disease with stage 1 through stage 4 chronic kidney disease, or unspecified chronic kidney disease: Secondary | ICD-10-CM | POA: Diagnosis not present

## 2017-08-31 DIAGNOSIS — K219 Gastro-esophageal reflux disease without esophagitis: Secondary | ICD-10-CM | POA: Diagnosis not present

## 2017-08-31 DIAGNOSIS — Z853 Personal history of malignant neoplasm of breast: Secondary | ICD-10-CM | POA: Diagnosis not present

## 2017-08-31 DIAGNOSIS — Z85828 Personal history of other malignant neoplasm of skin: Secondary | ICD-10-CM | POA: Diagnosis not present

## 2017-08-31 DIAGNOSIS — Z79899 Other long term (current) drug therapy: Secondary | ICD-10-CM | POA: Diagnosis not present

## 2017-08-31 DIAGNOSIS — C884 Extranodal marginal zone B-cell lymphoma of mucosa-associated lymphoid tissue [MALT-lymphoma]: Secondary | ICD-10-CM | POA: Diagnosis not present

## 2017-08-31 DIAGNOSIS — E785 Hyperlipidemia, unspecified: Secondary | ICD-10-CM | POA: Diagnosis not present

## 2017-08-31 DIAGNOSIS — N189 Chronic kidney disease, unspecified: Secondary | ICD-10-CM | POA: Diagnosis not present

## 2017-08-31 DIAGNOSIS — Z510 Encounter for antineoplastic radiation therapy: Secondary | ICD-10-CM | POA: Diagnosis not present

## 2017-09-01 ENCOUNTER — Ambulatory Visit
Admission: RE | Admit: 2017-09-01 | Discharge: 2017-09-01 | Disposition: A | Discharge status: Home / Self Care | Payer: Medicare HMO | Source: Ambulatory Visit | Attending: Radiation Oncology | Admitting: Radiation Oncology

## 2017-09-01 DIAGNOSIS — Z9011 Acquired absence of right breast and nipple: Secondary | ICD-10-CM | POA: Diagnosis not present

## 2017-09-01 DIAGNOSIS — K219 Gastro-esophageal reflux disease without esophagitis: Secondary | ICD-10-CM | POA: Diagnosis not present

## 2017-09-01 DIAGNOSIS — E785 Hyperlipidemia, unspecified: Secondary | ICD-10-CM | POA: Diagnosis not present

## 2017-09-01 DIAGNOSIS — I129 Hypertensive chronic kidney disease with stage 1 through stage 4 chronic kidney disease, or unspecified chronic kidney disease: Secondary | ICD-10-CM | POA: Diagnosis not present

## 2017-09-01 DIAGNOSIS — C884 Extranodal marginal zone B-cell lymphoma of mucosa-associated lymphoid tissue [MALT-lymphoma]: Secondary | ICD-10-CM | POA: Diagnosis not present

## 2017-09-01 DIAGNOSIS — Z510 Encounter for antineoplastic radiation therapy: Secondary | ICD-10-CM | POA: Diagnosis not present

## 2017-09-01 DIAGNOSIS — N189 Chronic kidney disease, unspecified: Secondary | ICD-10-CM | POA: Diagnosis not present

## 2017-09-01 DIAGNOSIS — Z853 Personal history of malignant neoplasm of breast: Secondary | ICD-10-CM | POA: Diagnosis not present

## 2017-09-01 DIAGNOSIS — Z79899 Other long term (current) drug therapy: Secondary | ICD-10-CM | POA: Diagnosis not present

## 2017-09-01 DIAGNOSIS — Z85828 Personal history of other malignant neoplasm of skin: Secondary | ICD-10-CM | POA: Diagnosis not present

## 2017-09-02 ENCOUNTER — Ambulatory Visit
Admission: RE | Admit: 2017-09-02 | Discharge: 2017-09-02 | Disposition: A | Discharge status: Home / Self Care | Payer: Medicare HMO | Source: Ambulatory Visit | Attending: Radiation Oncology | Admitting: Radiation Oncology

## 2017-09-02 DIAGNOSIS — Z9011 Acquired absence of right breast and nipple: Secondary | ICD-10-CM | POA: Diagnosis not present

## 2017-09-02 DIAGNOSIS — Z79899 Other long term (current) drug therapy: Secondary | ICD-10-CM | POA: Diagnosis not present

## 2017-09-02 DIAGNOSIS — N189 Chronic kidney disease, unspecified: Secondary | ICD-10-CM | POA: Diagnosis not present

## 2017-09-02 DIAGNOSIS — I129 Hypertensive chronic kidney disease with stage 1 through stage 4 chronic kidney disease, or unspecified chronic kidney disease: Secondary | ICD-10-CM | POA: Diagnosis not present

## 2017-09-02 DIAGNOSIS — Z853 Personal history of malignant neoplasm of breast: Secondary | ICD-10-CM | POA: Diagnosis not present

## 2017-09-02 DIAGNOSIS — K219 Gastro-esophageal reflux disease without esophagitis: Secondary | ICD-10-CM | POA: Diagnosis not present

## 2017-09-02 DIAGNOSIS — C884 Extranodal marginal zone B-cell lymphoma of mucosa-associated lymphoid tissue [MALT-lymphoma]: Secondary | ICD-10-CM | POA: Diagnosis not present

## 2017-09-02 DIAGNOSIS — Z510 Encounter for antineoplastic radiation therapy: Secondary | ICD-10-CM | POA: Diagnosis not present

## 2017-09-02 DIAGNOSIS — Z85828 Personal history of other malignant neoplasm of skin: Secondary | ICD-10-CM | POA: Diagnosis not present

## 2017-09-02 DIAGNOSIS — E785 Hyperlipidemia, unspecified: Secondary | ICD-10-CM | POA: Diagnosis not present

## 2017-09-03 ENCOUNTER — Ambulatory Visit
Admission: RE | Admit: 2017-09-03 | Discharge: 2017-09-03 | Disposition: A | Discharge status: Home / Self Care | Payer: Medicare HMO | Source: Ambulatory Visit | Attending: Radiation Oncology | Admitting: Radiation Oncology

## 2017-09-03 DIAGNOSIS — Z510 Encounter for antineoplastic radiation therapy: Secondary | ICD-10-CM | POA: Diagnosis not present

## 2017-09-03 DIAGNOSIS — N189 Chronic kidney disease, unspecified: Secondary | ICD-10-CM | POA: Diagnosis not present

## 2017-09-03 DIAGNOSIS — I129 Hypertensive chronic kidney disease with stage 1 through stage 4 chronic kidney disease, or unspecified chronic kidney disease: Secondary | ICD-10-CM | POA: Diagnosis not present

## 2017-09-03 DIAGNOSIS — K219 Gastro-esophageal reflux disease without esophagitis: Secondary | ICD-10-CM | POA: Diagnosis not present

## 2017-09-03 DIAGNOSIS — Z853 Personal history of malignant neoplasm of breast: Secondary | ICD-10-CM | POA: Diagnosis not present

## 2017-09-03 DIAGNOSIS — Z85828 Personal history of other malignant neoplasm of skin: Secondary | ICD-10-CM | POA: Diagnosis not present

## 2017-09-03 DIAGNOSIS — C884 Extranodal marginal zone B-cell lymphoma of mucosa-associated lymphoid tissue [MALT-lymphoma]: Secondary | ICD-10-CM | POA: Diagnosis not present

## 2017-09-03 DIAGNOSIS — E785 Hyperlipidemia, unspecified: Secondary | ICD-10-CM | POA: Diagnosis not present

## 2017-09-03 DIAGNOSIS — Z9011 Acquired absence of right breast and nipple: Secondary | ICD-10-CM | POA: Diagnosis not present

## 2017-09-03 DIAGNOSIS — Z79899 Other long term (current) drug therapy: Secondary | ICD-10-CM | POA: Diagnosis not present

## 2017-09-04 ENCOUNTER — Ambulatory Visit: Payer: Medicare HMO

## 2017-09-04 ENCOUNTER — Ambulatory Visit: Admission: RE | Admit: 2017-09-04 | Payer: Medicare HMO | Source: Ambulatory Visit

## 2017-09-04 DIAGNOSIS — K219 Gastro-esophageal reflux disease without esophagitis: Secondary | ICD-10-CM | POA: Diagnosis not present

## 2017-09-04 DIAGNOSIS — E538 Deficiency of other specified B group vitamins: Secondary | ICD-10-CM | POA: Diagnosis not present

## 2017-09-04 DIAGNOSIS — I10 Essential (primary) hypertension: Secondary | ICD-10-CM | POA: Diagnosis not present

## 2017-09-04 DIAGNOSIS — G4709 Other insomnia: Secondary | ICD-10-CM | POA: Diagnosis not present

## 2017-09-04 DIAGNOSIS — E1129 Type 2 diabetes mellitus with other diabetic kidney complication: Secondary | ICD-10-CM | POA: Diagnosis not present

## 2017-09-04 DIAGNOSIS — N183 Chronic kidney disease, stage 3 (moderate): Secondary | ICD-10-CM | POA: Diagnosis not present

## 2017-09-04 DIAGNOSIS — R809 Proteinuria, unspecified: Secondary | ICD-10-CM | POA: Diagnosis not present

## 2017-09-04 DIAGNOSIS — E782 Mixed hyperlipidemia: Secondary | ICD-10-CM | POA: Diagnosis not present

## 2017-09-04 DIAGNOSIS — Z79899 Other long term (current) drug therapy: Secondary | ICD-10-CM | POA: Diagnosis not present

## 2017-09-04 DIAGNOSIS — C884 Extranodal marginal zone B-cell lymphoma of mucosa-associated lymphoid tissue [MALT-lymphoma]: Secondary | ICD-10-CM | POA: Diagnosis not present

## 2017-09-04 DIAGNOSIS — R69 Illness, unspecified: Secondary | ICD-10-CM | POA: Diagnosis not present

## 2017-09-07 ENCOUNTER — Ambulatory Visit
Admission: RE | Admit: 2017-09-07 | Discharge: 2017-09-07 | Disposition: A | Discharge status: Home / Self Care | Payer: Medicare HMO | Source: Ambulatory Visit | Attending: Radiation Oncology | Admitting: Radiation Oncology

## 2017-09-07 DIAGNOSIS — Z9011 Acquired absence of right breast and nipple: Secondary | ICD-10-CM | POA: Diagnosis not present

## 2017-09-07 DIAGNOSIS — Z79899 Other long term (current) drug therapy: Secondary | ICD-10-CM | POA: Diagnosis not present

## 2017-09-07 DIAGNOSIS — Z85828 Personal history of other malignant neoplasm of skin: Secondary | ICD-10-CM | POA: Diagnosis not present

## 2017-09-07 DIAGNOSIS — N189 Chronic kidney disease, unspecified: Secondary | ICD-10-CM | POA: Diagnosis not present

## 2017-09-07 DIAGNOSIS — I129 Hypertensive chronic kidney disease with stage 1 through stage 4 chronic kidney disease, or unspecified chronic kidney disease: Secondary | ICD-10-CM | POA: Diagnosis not present

## 2017-09-07 DIAGNOSIS — Z510 Encounter for antineoplastic radiation therapy: Secondary | ICD-10-CM | POA: Diagnosis not present

## 2017-09-07 DIAGNOSIS — K219 Gastro-esophageal reflux disease without esophagitis: Secondary | ICD-10-CM | POA: Diagnosis not present

## 2017-09-07 DIAGNOSIS — Z853 Personal history of malignant neoplasm of breast: Secondary | ICD-10-CM | POA: Diagnosis not present

## 2017-09-07 DIAGNOSIS — E785 Hyperlipidemia, unspecified: Secondary | ICD-10-CM | POA: Diagnosis not present

## 2017-09-07 DIAGNOSIS — C884 Extranodal marginal zone B-cell lymphoma of mucosa-associated lymphoid tissue [MALT-lymphoma]: Secondary | ICD-10-CM | POA: Diagnosis not present

## 2017-09-08 ENCOUNTER — Ambulatory Visit
Admission: RE | Admit: 2017-09-08 | Discharge: 2017-09-08 | Disposition: A | Discharge status: Home / Self Care | Payer: Medicare HMO | Source: Ambulatory Visit | Attending: Radiation Oncology | Admitting: Radiation Oncology

## 2017-09-08 DIAGNOSIS — Z85828 Personal history of other malignant neoplasm of skin: Secondary | ICD-10-CM | POA: Diagnosis not present

## 2017-09-08 DIAGNOSIS — Z9011 Acquired absence of right breast and nipple: Secondary | ICD-10-CM | POA: Diagnosis not present

## 2017-09-08 DIAGNOSIS — E785 Hyperlipidemia, unspecified: Secondary | ICD-10-CM | POA: Diagnosis not present

## 2017-09-08 DIAGNOSIS — K219 Gastro-esophageal reflux disease without esophagitis: Secondary | ICD-10-CM | POA: Diagnosis not present

## 2017-09-08 DIAGNOSIS — Z853 Personal history of malignant neoplasm of breast: Secondary | ICD-10-CM | POA: Diagnosis not present

## 2017-09-08 DIAGNOSIS — Z510 Encounter for antineoplastic radiation therapy: Secondary | ICD-10-CM | POA: Diagnosis not present

## 2017-09-08 DIAGNOSIS — I129 Hypertensive chronic kidney disease with stage 1 through stage 4 chronic kidney disease, or unspecified chronic kidney disease: Secondary | ICD-10-CM | POA: Diagnosis not present

## 2017-09-08 DIAGNOSIS — Z79899 Other long term (current) drug therapy: Secondary | ICD-10-CM | POA: Diagnosis not present

## 2017-09-08 DIAGNOSIS — C884 Extranodal marginal zone B-cell lymphoma of mucosa-associated lymphoid tissue [MALT-lymphoma]: Secondary | ICD-10-CM | POA: Diagnosis not present

## 2017-09-08 DIAGNOSIS — N189 Chronic kidney disease, unspecified: Secondary | ICD-10-CM | POA: Diagnosis not present

## 2017-09-09 ENCOUNTER — Ambulatory Visit
Admission: RE | Admit: 2017-09-09 | Discharge: 2017-09-09 | Disposition: A | Discharge status: Home / Self Care | Payer: Medicare HMO | Source: Ambulatory Visit | Attending: Radiation Oncology | Admitting: Radiation Oncology

## 2017-09-09 DIAGNOSIS — C884 Extranodal marginal zone B-cell lymphoma of mucosa-associated lymphoid tissue [MALT-lymphoma]: Secondary | ICD-10-CM | POA: Diagnosis not present

## 2017-09-09 DIAGNOSIS — Z85828 Personal history of other malignant neoplasm of skin: Secondary | ICD-10-CM | POA: Diagnosis not present

## 2017-09-09 DIAGNOSIS — Z510 Encounter for antineoplastic radiation therapy: Secondary | ICD-10-CM | POA: Diagnosis not present

## 2017-09-09 DIAGNOSIS — Z9011 Acquired absence of right breast and nipple: Secondary | ICD-10-CM | POA: Diagnosis not present

## 2017-09-09 DIAGNOSIS — E785 Hyperlipidemia, unspecified: Secondary | ICD-10-CM | POA: Diagnosis not present

## 2017-09-09 DIAGNOSIS — Z853 Personal history of malignant neoplasm of breast: Secondary | ICD-10-CM | POA: Diagnosis not present

## 2017-09-09 DIAGNOSIS — I129 Hypertensive chronic kidney disease with stage 1 through stage 4 chronic kidney disease, or unspecified chronic kidney disease: Secondary | ICD-10-CM | POA: Diagnosis not present

## 2017-09-09 DIAGNOSIS — K219 Gastro-esophageal reflux disease without esophagitis: Secondary | ICD-10-CM | POA: Diagnosis not present

## 2017-09-09 DIAGNOSIS — N189 Chronic kidney disease, unspecified: Secondary | ICD-10-CM | POA: Diagnosis not present

## 2017-09-09 DIAGNOSIS — Z79899 Other long term (current) drug therapy: Secondary | ICD-10-CM | POA: Diagnosis not present

## 2017-09-10 ENCOUNTER — Ambulatory Visit
Admission: RE | Admit: 2017-09-10 | Discharge: 2017-09-10 | Disposition: A | Discharge status: Home / Self Care | Payer: Medicare HMO | Source: Ambulatory Visit | Attending: Radiation Oncology | Admitting: Radiation Oncology

## 2017-09-10 DIAGNOSIS — Z85828 Personal history of other malignant neoplasm of skin: Secondary | ICD-10-CM | POA: Diagnosis not present

## 2017-09-10 DIAGNOSIS — Z9011 Acquired absence of right breast and nipple: Secondary | ICD-10-CM | POA: Diagnosis not present

## 2017-09-10 DIAGNOSIS — Z853 Personal history of malignant neoplasm of breast: Secondary | ICD-10-CM | POA: Diagnosis not present

## 2017-09-10 DIAGNOSIS — C884 Extranodal marginal zone B-cell lymphoma of mucosa-associated lymphoid tissue [MALT-lymphoma]: Secondary | ICD-10-CM | POA: Diagnosis not present

## 2017-09-10 DIAGNOSIS — I129 Hypertensive chronic kidney disease with stage 1 through stage 4 chronic kidney disease, or unspecified chronic kidney disease: Secondary | ICD-10-CM | POA: Diagnosis not present

## 2017-09-10 DIAGNOSIS — Z510 Encounter for antineoplastic radiation therapy: Secondary | ICD-10-CM | POA: Diagnosis not present

## 2017-09-10 DIAGNOSIS — K219 Gastro-esophageal reflux disease without esophagitis: Secondary | ICD-10-CM | POA: Diagnosis not present

## 2017-09-10 DIAGNOSIS — N189 Chronic kidney disease, unspecified: Secondary | ICD-10-CM | POA: Diagnosis not present

## 2017-09-10 DIAGNOSIS — Z79899 Other long term (current) drug therapy: Secondary | ICD-10-CM | POA: Diagnosis not present

## 2017-09-10 DIAGNOSIS — E785 Hyperlipidemia, unspecified: Secondary | ICD-10-CM | POA: Diagnosis not present

## 2017-09-11 ENCOUNTER — Ambulatory Visit
Admission: RE | Admit: 2017-09-11 | Discharge: 2017-09-11 | Disposition: A | Discharge status: Home / Self Care | Payer: Medicare HMO | Source: Ambulatory Visit | Attending: Radiation Oncology | Admitting: Radiation Oncology

## 2017-09-11 ENCOUNTER — Ambulatory Visit: Payer: Medicare HMO

## 2017-09-11 DIAGNOSIS — Z85828 Personal history of other malignant neoplasm of skin: Secondary | ICD-10-CM | POA: Diagnosis not present

## 2017-09-11 DIAGNOSIS — Z853 Personal history of malignant neoplasm of breast: Secondary | ICD-10-CM | POA: Diagnosis not present

## 2017-09-11 DIAGNOSIS — Z79899 Other long term (current) drug therapy: Secondary | ICD-10-CM | POA: Diagnosis not present

## 2017-09-11 DIAGNOSIS — K219 Gastro-esophageal reflux disease without esophagitis: Secondary | ICD-10-CM | POA: Diagnosis not present

## 2017-09-11 DIAGNOSIS — C884 Extranodal marginal zone B-cell lymphoma of mucosa-associated lymphoid tissue [MALT-lymphoma]: Secondary | ICD-10-CM | POA: Diagnosis not present

## 2017-09-11 DIAGNOSIS — Z510 Encounter for antineoplastic radiation therapy: Secondary | ICD-10-CM | POA: Diagnosis not present

## 2017-09-11 DIAGNOSIS — E785 Hyperlipidemia, unspecified: Secondary | ICD-10-CM | POA: Diagnosis not present

## 2017-09-11 DIAGNOSIS — I129 Hypertensive chronic kidney disease with stage 1 through stage 4 chronic kidney disease, or unspecified chronic kidney disease: Secondary | ICD-10-CM | POA: Diagnosis not present

## 2017-09-11 DIAGNOSIS — N189 Chronic kidney disease, unspecified: Secondary | ICD-10-CM | POA: Diagnosis not present

## 2017-09-11 DIAGNOSIS — Z9011 Acquired absence of right breast and nipple: Secondary | ICD-10-CM | POA: Diagnosis not present

## 2017-09-14 ENCOUNTER — Ambulatory Visit
Admission: RE | Admit: 2017-09-14 | Discharge: 2017-09-14 | Disposition: A | Discharge status: Home / Self Care | Payer: Medicare HMO | Source: Ambulatory Visit | Attending: Radiation Oncology | Admitting: Radiation Oncology

## 2017-09-14 DIAGNOSIS — C884 Extranodal marginal zone B-cell lymphoma of mucosa-associated lymphoid tissue [MALT-lymphoma]: Secondary | ICD-10-CM | POA: Diagnosis not present

## 2017-09-14 DIAGNOSIS — Z853 Personal history of malignant neoplasm of breast: Secondary | ICD-10-CM | POA: Diagnosis not present

## 2017-09-14 DIAGNOSIS — Z9011 Acquired absence of right breast and nipple: Secondary | ICD-10-CM | POA: Diagnosis not present

## 2017-09-14 DIAGNOSIS — Z79899 Other long term (current) drug therapy: Secondary | ICD-10-CM | POA: Diagnosis not present

## 2017-09-14 DIAGNOSIS — K219 Gastro-esophageal reflux disease without esophagitis: Secondary | ICD-10-CM | POA: Diagnosis not present

## 2017-09-14 DIAGNOSIS — I129 Hypertensive chronic kidney disease with stage 1 through stage 4 chronic kidney disease, or unspecified chronic kidney disease: Secondary | ICD-10-CM | POA: Diagnosis not present

## 2017-09-14 DIAGNOSIS — Z85828 Personal history of other malignant neoplasm of skin: Secondary | ICD-10-CM | POA: Diagnosis not present

## 2017-09-14 DIAGNOSIS — N189 Chronic kidney disease, unspecified: Secondary | ICD-10-CM | POA: Diagnosis not present

## 2017-09-14 DIAGNOSIS — Z510 Encounter for antineoplastic radiation therapy: Secondary | ICD-10-CM | POA: Diagnosis not present

## 2017-09-14 DIAGNOSIS — E785 Hyperlipidemia, unspecified: Secondary | ICD-10-CM | POA: Diagnosis not present

## 2017-10-09 ENCOUNTER — Other Ambulatory Visit: Payer: Self-pay

## 2017-10-09 ENCOUNTER — Ambulatory Visit
Admission: RE | Admit: 2017-10-09 | Discharge: 2017-10-09 | Disposition: A | Discharge status: Home / Self Care | Payer: Medicare HMO | Source: Ambulatory Visit | Attending: Radiation Oncology | Admitting: Radiation Oncology

## 2017-10-09 ENCOUNTER — Encounter: Payer: Self-pay | Admitting: Radiation Oncology

## 2017-10-09 VITALS — BP 137/75 | HR 77 | Temp 97.9°F | Resp 20 | Wt 158.2 lb

## 2017-10-09 DIAGNOSIS — C884 Extranodal marginal zone B-cell lymphoma of mucosa-associated lymphoid tissue [MALT-lymphoma]: Secondary | ICD-10-CM

## 2017-10-09 DIAGNOSIS — Z923 Personal history of irradiation: Secondary | ICD-10-CM | POA: Diagnosis not present

## 2017-10-09 DIAGNOSIS — C8581 Other specified types of non-Hodgkin lymphoma, lymph nodes of head, face, and neck: Secondary | ICD-10-CM | POA: Diagnosis not present

## 2017-10-09 NOTE — Progress Notes (Signed)
Radiation Oncology Follow up Note  Name: Sandy Hickman   Date:   10/09/2017 MRN:  [id number] DOB: 1934/09/24    This 83 y.o. female presents to the clinic today for one-month follow-up status post electron beam therapy to her left supraorbital ridge for extranodal marginal zone lymphoma.  REFERRING PROVIDER: Ezequiel Kayser, MD  HPI: Patient is a 83 year old female now seen out 1 month having completed electron beam therapy to her supraorbital ridge for a extranodal marginal zone lymphoma of the ocular adnexa. Seen today in routine follow-up she is doing well. She specifically denies any change in her vision any pain. She does have some soreness in her right axilla from prior surgery most likely related to scar formation.).  COMPLICATIONS OF TREATMENT: none  FOLLOW UP COMPLIANCE: keeps appointments   PHYSICAL EXAM:  BP 137/75   Pulse 77   Temp 97.9 F (36.6 C)   Resp 20   Wt 158 lb 2.9 oz (71.7 kg)   BMI 25.53 kg/m  She does have some firmness still persist in the left orbital ridge but I believe this is scar tissue. No other evidence of preauricular sub-digastric cervical or supraclavicular adenopathy is identified. Well-developed well-nourished patient in NAD. HEENT reveals PERLA, EOMI, discs not visualized.  Oral cavity is clear. No oral mucosal lesions are identified. Neck is clear without evidence of cervical or supraclavicular adenopathy. Lungs are clear to A&P. Cardiac examination is essentially unremarkable with regular rate and rhythm without murmur rub or thrill. Abdomen is benign with no organomegaly or masses noted. Motor sensory and DTR levels are equal and symmetric in the upper and lower extremities. Cranial nerves II through XII are grossly intact. Proprioception is intact. No peripheral adenopathy or edema is identified. No motor or sensory levels are noted. Crude visual fields are within normal range.  RADIOLOGY RESULTS: No current films for review  PLAN: At the  present time she is doing well. I'm please were overall progress. I've asked to see her back in 4-5 months for follow-up. She continues close follow-up care with medical oncology. Patient knows to call with any concerns.  I would like to take this opportunity to thank you for allowing me to participate in the care of your patient.Armstead Peaks., MD

## 2017-10-13 DIAGNOSIS — R300 Dysuria: Secondary | ICD-10-CM | POA: Diagnosis not present

## 2017-10-13 DIAGNOSIS — A499 Bacterial infection, unspecified: Secondary | ICD-10-CM | POA: Diagnosis not present

## 2017-10-13 DIAGNOSIS — I4891 Unspecified atrial fibrillation: Secondary | ICD-10-CM | POA: Diagnosis not present

## 2017-10-13 DIAGNOSIS — N390 Urinary tract infection, site not specified: Secondary | ICD-10-CM | POA: Diagnosis not present

## 2017-10-20 DIAGNOSIS — I10 Essential (primary) hypertension: Secondary | ICD-10-CM | POA: Diagnosis not present

## 2017-10-20 DIAGNOSIS — E782 Mixed hyperlipidemia: Secondary | ICD-10-CM | POA: Diagnosis not present

## 2017-10-20 DIAGNOSIS — I480 Paroxysmal atrial fibrillation: Secondary | ICD-10-CM | POA: Diagnosis not present

## 2017-10-20 DIAGNOSIS — I4891 Unspecified atrial fibrillation: Secondary | ICD-10-CM | POA: Diagnosis not present

## 2017-10-22 NOTE — Progress Notes (Signed)
10/23/2017 11:12 AM   Sandy Hickman January 11, 1934 [id number]  Referring provider: Ezequiel Kayser, MD [facility] [place], [place] [address]  Chief Complaint  Patient presents with  . Recurrent UTI    HPI: Patient is an 83 year old Caucasian female with a history of recurrent UTI's and vaginal atrophy who presents today for dysuria.   Background history Patient is a 67 -year-old Caucasian female who is referred to [place] by, [facility], for recurrent urinary tract infections.  Patient states that she has had three urinary tract infections over the last few months.  Her symptoms with a urinary tract infection consist of pelvic pressure, urgency and frequency and dysuria.  She denies back pain, abdominal pain or flank pain.  She has not had any recent fevers, chills, nausea or vomiting.  She does have a history of nephrolithiasis and GU surgery.  Reviewing her records,  she has had three documented urinary tract infections.  Two for pan-sensitive E.coli and one for Klebsiella.   She is not sexually active.  She is post menopausal.  She admits to diarrhea on occasions.   She does engage in good perineal hygiene. She does not take tub baths.   She does not have incontinence.  She is not having pain with bladder filling.  She underwent contrast CT in 07/2015 and kidneys and bladder were unremarkable.  I have independently reviewed the films.  She is not drinking a lot of water daily.  She does admit to drinking sweet tea and coffee.  She does drink cranberry juice.  She is not experiencing symptoms of an urinary tract infection at this time.  UA most likely contaminated.  PVR is 0 mL.    RUS performed on 12/21/2016 noted no hydronephrosis nor evidence of echogenic shadowing stones.  No definite bladder abnormality but the images of the bladder limited by partial distention.    She states last week, she started to having suprapubic pressure, dysuria and urgency.  She sought care  from Dr. Dorthula Perfect office.  Clean catch UA was positive for 10-50 WBC's and nitrite positive.  She was prescribed Cipro.  Her urine culture was positive for mixed urogenital flora.  She states her symptoms abated.  Today, she is experiencing urgency x 0-3, frequency x 4-7, not restricting fluids to avoid visits to the restroom, is engaging in toilet mapping, incontinence x 0-3 and nocturia x 0-3.   She denies gross hematuria, suprapubic pain or dysuria.  She has not fevers, chills, nausea and vomiting.  She admits she is not drinking enough water daily.    She is traveling to [place] for Christmas and is worried about having an UTI while she is visiting her family.    PMH: Past Medical History:  Diagnosis Date  . Allergy   . Arrhythmia   . Arthritis   . Breast cancer ([place]) 2006   rt mastectomy  . Chronic kidney disease    chronic uti, kidney stones  . GERD (gastroesophageal reflux disease)   . Glaucoma   . HLD (hyperlipidemia)   . Hypertension   . 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 Personal history of chemotherapy   . Personal history of radiation therapy   . 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 ABDOMINAL HYSTERECTOMY    . BLADDER SUSPENSION     15 years ago  .  BREAST SURGERY    . cataract surgery Bilateral   . CHOLECYSTECTOMY    . JOINT REPLACEMENT    . KNEE SURGERY Right    2016  . MASTECTOMY Right 2006   rt mast /rad/chemo  . ROTATOR CUFF REPAIR     13 years    Home Medications:  Allergies as of 10/23/2017      Reactions   Neosporin [neomycin-bacitracin Zn-polymyx] Dermatitis   Sulfa Antibiotics Itching, Rash   Enalapril Other (See Comments)   Oxycodone-acetaminophen Itching      Medication List        Accurate as of 10/23/17 11:12 AM. Always use your most recent med list.          amLODipine 2.5 MG tablet Commonly known as:  NORVASC Take 2.5 mg by  mouth daily.   ascorbic acid 500 MG tablet Commonly known as:  VITAMIN C Take 500 mg by mouth daily.   aspirin 81 MG tablet Take 81 mg by mouth daily.   azelastine 0.1 % nasal spray Commonly known as:  ASTELIN Place into the nose.   CENTRUM SILVER PO Take 1 each by mouth daily.   clobetasol cream 0.05 % Commonly known as:  TEMOVATE Apply 1 application topically 2 (two) times daily as needed.   cyanocobalamin 500 MCG tablet Take 500 mcg by mouth daily.   ELIQUIS 5 MG Tabs tablet Generic drug:  apixaban Take 5 mg by mouth 2 (two) times daily.   ezetimibe 10 MG tablet Commonly known as:  ZETIA Take 10 mg by mouth daily.   FISH OIL PO Take 500 mg by mouth daily.   gabapentin 300 MG capsule Commonly known as:  NEURONTIN Take 300 mg by mouth. 1-2 capsules nightly as directed   gemfibrozil 600 MG tablet Commonly known as:  LOPID Take 600 mg by mouth daily.   latanoprost 0.005 % ophthalmic solution Commonly known as:  XALATAN Place 1 drop into both eyes at bedtime.   loratadine 10 MG tablet Commonly known as:  CLARITIN Take 10 mg by mouth daily as needed.   LORazepam 1 MG tablet Commonly known as:  ATIVAN Take 1 tablet (1 mg total) by mouth as directed.   losartan 100 MG tablet Commonly known as:  COZAAR Take 100 mg by mouth daily.   Melatonin 10 MG Tabs Take by mouth.   methenamine 1 g tablet Commonly known as:  HIPREX Take 1 tablet (1 g total) by mouth 2 (two) times daily with a meal.   metoprolol succinate 25 MG 24 hr tablet Commonly known as:  TOPROL-XL Take 25 mg by mouth daily.   mirtazapine 15 MG tablet Commonly known as:  REMERON Take 15 mg by mouth at bedtime.   omeprazole 20 MG capsule Commonly known as:  PRILOSEC Take 20 mg by mouth daily.   ondansetron 4 MG tablet Commonly known as:  ZOFRAN Take 1 tablet (4 mg total) by mouth every 6 (six) hours.   PROBIOTIC ACIDOPHILUS PO Take 1 Dose by mouth daily.   Timolol Maleate PF 0.25 %  Soln Apply 1 drop to eye daily.       Allergies:  Allergies  Allergen Reactions  . Neosporin [Neomycin-Bacitracin Zn-Polymyx] Dermatitis  . Sulfa Antibiotics Itching and Rash  . Enalapril Other (See Comments)  . Oxycodone-Acetaminophen Itching    Family History: Family History  Problem Relation Age of Onset  . Breast cancer Daughter 1  . Hypertension Father   . Stroke Father   .  Diabetes Sister   . Breast cancer Cousin        pat cousin  . Kidney cancer Son   . Prostate cancer Neg Hx   . Bladder Cancer Neg Hx     Social History:  reports that  has never smoked. she has never used smokeless tobacco. She reports that she drinks alcohol. She reports that she does not use drugs.  ROS: UROLOGY Frequent Urination?: No Hard to postpone urination?: Yes Burning/pain with urination?: Yes Get up at night to urinate?: Yes Leakage of urine?: No Urine stream starts and stops?: No Trouble starting stream?: No Do you have to strain to urinate?: No Blood in urine?: No Urinary tract infection?: Yes Sexually transmitted disease?: No Injury to kidneys or bladder?: No Painful intercourse?: No Weak stream?: No Currently pregnant?: No Vaginal bleeding?: No Last menstrual period?: n  Gastrointestinal Nausea?: No Vomiting?: No Indigestion/heartburn?: No Diarrhea?: No Constipation?: No  Constitutional Fever: No Night sweats?: No Weight loss?: No Fatigue?: Yes  Skin Skin rash/lesions?: No Itching?: No  Eyes Blurred vision?: No Double vision?: No  Ears/Nose/Throat Sore throat?: No Sinus problems?: Yes  Hematologic/Lymphatic Swollen glands?: No Easy bruising?: Yes  Cardiovascular Leg swelling?: No Chest pain?: No  Respiratory Cough?: No Shortness of breath?: No  Endocrine Excessive thirst?: No  Musculoskeletal Back pain?: No Joint pain?: No  Neurological Headaches?: No Dizziness?: No  Psychologic Depression?: No Anxiety?: No  Physical  Exam: BP (!) 153/73   Pulse 68   Ht 5\' 6"  (1.676 m)   Wt 154 lb (69.9 kg)   BMI 24.86 kg/m   Constitutional: Well nourished. Alert and oriented, No acute distress. HEENT: [place] AT, moist mucus membranes. Trachea midline, no masses. Cardiovascular: No clubbing, cyanosis, or edema. Respiratory: Normal respiratory effort, no increased work of breathing. Skin: No rashes, bruises or suspicious lesions. Lymph: No cervical or inguinal adenopathy. Neurologic: Grossly intact, no focal deficits, moving all 4 extremities. Psychiatric: Normal mood and affect.  Laboratory Data: Lab Results  Component Value Date   WBC 10.2 07/17/2017   HGB 11.9 (L) 07/17/2017   HCT 36.0 07/17/2017   MCV 89.7 07/17/2017   PLT 294 07/17/2017    Lab Results  Component Value Date   CREATININE 0.91 07/17/2017    Lab Results  Component Value Date   AST 33 07/17/2017   Lab Results  Component Value Date   ALT 18 07/17/2017   Pertinent imaging CLINICAL DATA:  Recurrent urinary tract infection, history of urinary tract stones.  EXAM: RENAL / URINARY TRACT ULTRASOUND COMPLETE  COMPARISON:  Abdominopelvic CT scan of August 10, 2015  FINDINGS: Right Kidney:  Length: 9.7 cm. Echogenicity within normal limits. No mass or hydronephrosis visualized.  Left Kidney:  Length: 10.2 cm. Echogenicity within normal limits. No mass or hydronephrosis visualized.  Bladder:  The urinary bladder is only partially distended due to recent voiding. No bladder stones are observed.  IMPRESSION: No hydronephrosis nor evidence of echogenic shadowing stones. No definite bladder abnormality but the images of the bladder limited by partial distention.   Electronically Signed   By: David  Martinique M.D.   On: 12/22/2016 11:46   Assessment & Plan:    1. Recurrent UTI's  - Reviewed UTI prevention strategies with patient  - Encouraged her to contact our office if she should experience symptoms of an  UTI for further instruction and management  - start Hiprex 1 gram bid  - RTC in 3 months for OAB questionnaire   2. Vaginal  atrophy  - not a candidate for estrogen vaginal cream due to diagnosis of breast cancer  - advised the patient to use vaginal probiotics and coconut/olive oil for discomfort                               Return in about 3 months (around 01/21/2018) for PVR and OAB questionnaire.  These notes generated with voice recognition software. I apologize for typographical errors.  Zara Council, [facility] [address], [place],  [address] [phone]

## 2017-10-23 ENCOUNTER — Telehealth: Payer: Self-pay | Admitting: Urology

## 2017-10-23 ENCOUNTER — Ambulatory Visit (INDEPENDENT_AMBULATORY_CARE_PROVIDER_SITE_OTHER): Payer: Medicare HMO | Admitting: Urology

## 2017-10-23 ENCOUNTER — Encounter: Payer: Self-pay | Admitting: Urology

## 2017-10-23 VITALS — BP 153/73 | HR 68 | Ht 66.0 in | Wt 154.0 lb

## 2017-10-23 DIAGNOSIS — N952 Postmenopausal atrophic vaginitis: Secondary | ICD-10-CM | POA: Diagnosis not present

## 2017-10-23 DIAGNOSIS — H401111 Primary open-angle glaucoma, right eye, mild stage: Secondary | ICD-10-CM | POA: Diagnosis not present

## 2017-10-23 DIAGNOSIS — N390 Urinary tract infection, site not specified: Secondary | ICD-10-CM | POA: Diagnosis not present

## 2017-10-23 MED ORDER — METHENAMINE HIPPURATE 1 G PO TABS: 1.0000 g | ORAL_TABLET | Freq: Two times a day (BID) | ORAL | 3 refills | Status: DC

## 2017-10-23 NOTE — Telephone Encounter (Signed)
Patient called and said that the medication you gave her today was $95.00 and she can not afford that. What else can you give her?   Sharyn Lull

## 2017-10-23 NOTE — Telephone Encounter (Signed)
There isn't a substitute for this medication.  Can we check into why it is so expensive?

## 2017-10-26 NOTE — Telephone Encounter (Signed)
Spoke with pt in reference to which medication cost so much. Pt stated its the hiprex. Pt requested another abx.

## 2017-10-26 NOTE — Telephone Encounter (Signed)
I would like [place] to ask her insurance company regarding the Hiprex.  It is not an antibiotic.  In regards to the antibiotic, I will let her have Augmentin 875/125, one tablet bid # 14, no refills to have on hand for her trip to [place].  She is not to take the antibiotic unless she is in [place] with UTI symptoms.  If she needs to take the antibiotic while in [place], she needs to make a follow up appointment with me.

## 2017-10-28 DIAGNOSIS — I480 Paroxysmal atrial fibrillation: Secondary | ICD-10-CM | POA: Diagnosis not present

## 2017-10-30 ENCOUNTER — Encounter
Admission: RE | Admit: 2017-10-30 | Discharge: 2017-10-30 | Disposition: A | Discharge status: Home / Self Care | Payer: Medicare HMO | Source: Ambulatory Visit | Attending: Oncology | Admitting: Oncology

## 2017-10-30 DIAGNOSIS — C884 Extranodal marginal zone B-cell lymphoma of mucosa-associated lymphoid tissue [MALT-lymphoma]: Secondary | ICD-10-CM | POA: Insufficient documentation

## 2017-10-30 DIAGNOSIS — C8330 Diffuse large B-cell lymphoma, unspecified site: Secondary | ICD-10-CM | POA: Diagnosis not present

## 2017-10-30 LAB — GLUCOSE, CAPILLARY: Glucose-Capillary: 117 mg/dL — ABNORMAL HIGH (ref 65–99)

## 2017-10-30 MED ORDER — FLUDEOXYGLUCOSE F - 18 (FDG) INJECTION
12.9500 | Freq: Once | INTRAVENOUS | Status: AC | PRN
  Administered 2017-10-30: 12.95 via INTRAVENOUS

## 2017-11-04 NOTE — Progress Notes (Signed)
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  INTERVAL HISTORY: Patient returns to clinic today for further evaluation and discussion of her imaging results. She continues to feel well and is asymptomatic. She has no neurologic complaints. She has no visual complaints. She has a good appetite and denies weight loss. She denies any fevers, and night sweats, or weight loss. She has no chest pain or shortness of breath. She denies any nausea, vomiting, constipation, or diarrhea. She has no urinary complaints. Patient feels at her baseline and offers no specific complaints today.  REVIEW OF SYSTEMS:   Review of Systems  Constitutional: Negative.  Negative for diaphoresis, fever, malaise/fatigue and weight loss.  Eyes: Negative.  Negative for blurred vision, double vision and pain.  Respiratory: Negative.  Negative for cough and shortness of breath.   Cardiovascular: Negative.  Negative for chest pain and leg swelling.  Gastrointestinal: Negative.  Negative for abdominal pain.  Genitourinary: Negative.   Musculoskeletal: Negative.   Skin: Negative.  Negative for rash.  Neurological: Negative.  Negative for sensory change, focal weakness and weakness.  Psychiatric/Behavioral: Negative.  The patient is not nervous/anxious.     As per HPI. Otherwise, a complete review of systems is negative.  PAST MEDICAL HISTORY: Past Medical History:  Diagnosis Date  . Allergy   . Arrhythmia   . Arthritis   . Breast cancer ([place]) 2006   rt mastectomy  . Chronic kidney disease    chronic uti, kidney stones  . GERD (gastroesophageal reflux disease)   . Glaucoma   . HLD (hyperlipidemia)   . Hypertension   . 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  Personal history of chemotherapy   . Personal history of radiation therapy   . 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 ABDOMINAL HYSTERECTOMY    . BLADDER SUSPENSION     15 years ago  . BREAST SURGERY    . cataract surgery Bilateral   . CHOLECYSTECTOMY    . JOINT REPLACEMENT    . KNEE SURGERY Right    2016  . MASTECTOMY Right 2006   rt mast /rad/chemo  . ROTATOR CUFF REPAIR     13 years    FAMILY HISTORY: Family History  Problem Relation Age of Onset  . Breast cancer Daughter 3  . Hypertension Father   . Stroke Father   . Diabetes Sister   . Breast cancer Cousin        pat cousin  . Kidney cancer Son   . Prostate cancer Neg Hx   . Bladder Cancer Neg Hx     ADVANCED DIRECTIVES (Y/N):  N  HEALTH MAINTENANCE: Social History   Tobacco Use  . Smoking status: Never Smoker  . Smokeless tobacco: Never Used  Substance Use Topics  . Alcohol use: Yes    Comment: Occasional  . Drug use: No     Colonoscopy:  PAP:  Bone density:  Lipid panel:  Allergies  Allergen Reactions  . Neosporin [Neomycin-Bacitracin Zn-Polymyx] Dermatitis  . Sulfa Antibiotics Itching and Rash  . Enalapril Other (See Comments)  . Oxycodone-Acetaminophen Itching    Current Outpatient Medications  Medication Sig Dispense  Refill  . apixaban (ELIQUIS) 5 MG TABS tablet Take 5 mg by mouth 2 (two) times daily.    Marland Kitchen ascorbic acid (VITAMIN C) 500 MG tablet Take 500 mg by mouth daily.    Marland Kitchen aspirin 81 MG tablet Take 81 mg by mouth daily.    Marland Kitchen azelastine (ASTELIN) 0.1 % nasal spray Place into the nose.    . clobetasol cream (TEMOVATE) 7.12 % Apply 1 application topically 2 (two) times daily as needed.     . cyanocobalamin 500 MCG tablet Take 500 mcg by mouth daily.     Marland Kitchen gabapentin (NEURONTIN) 300 MG capsule Take 300 mg by mouth. 1-2 capsules nightly as directed    . gemfibrozil (LOPID) 600 MG tablet  Take 600 mg by mouth daily.    . Lactobacillus (PROBIOTIC ACIDOPHILUS PO) Take 1 Dose by mouth daily.     Marland Kitchen latanoprost (XALATAN) 0.005 % ophthalmic solution latanoprost 0.005 % eye drops  USE 1 DROP(S) IN BOTH EYES ONCE IN THE EVENINGS    . loratadine (CLARITIN) 10 MG tablet Take 10 mg by mouth daily as needed.     Marland Kitchen LORazepam (ATIVAN) 1 MG tablet Take 1 tablet (1 mg total) by mouth as directed. 30 tablet 0  . losartan (COZAAR) 100 MG tablet Take 100 mg by mouth daily.    . Melatonin 10 MG TABS Take by mouth.    . methenamine (HIPREX) 1 g tablet Take 1 tablet (1 g total) by mouth 2 (two) times daily with a meal. 60 tablet 3  . metoprolol succinate (TOPROL-XL) 25 MG 24 hr tablet Take 25 mg by mouth daily.    . mirtazapine (REMERON) 15 MG tablet Take 15 mg by mouth at bedtime.     . Multiple Vitamins-Minerals (CENTRUM SILVER PO) Take 1 each by mouth daily.    . Omega-3 Fatty Acids (FISH OIL PO) Take 500 mg by mouth daily.     Marland Kitchen omeprazole (PRILOSEC) 20 MG capsule Take 20 mg by mouth daily.    . ondansetron (ZOFRAN) 4 MG tablet Take 1 tablet (4 mg total) by mouth every 6 (six) hours. 12 tablet 0  . Timolol Maleate PF 0.25 % SOLN Apply 1 drop to eye daily.    Marland Kitchen ezetimibe (ZETIA) 10 MG tablet Take 10 mg by mouth daily.      No current facility-administered medications for this visit.     OBJECTIVE: Vitals:   11/06/17 0948  BP: 105/69  Pulse: 89  Resp: 18  Temp: (!) 97.1 F (36.2 C)     Body mass index is 24.91 kg/m.    ECOG FS:0 - Asymptomatic  General: Well-developed, well-nourished, no acute distress. Eyes: Pink conjunctiva, anicteric sclera. HEENT: Normocephalic, moist mucous membranes, clear oropharnyx. Lungs: Clear to auscultation bilaterally. Heart: Regular rate and rhythm. No rubs, murmurs, or gallops. Abdomen: Soft, nontender, nondistended. No organomegaly noted, normoactive bowel sounds. Musculoskeletal: No edema, cyanosis, or clubbing. Neuro: Alert, answering all  questions appropriately. Cranial nerves grossly intact. Skin: No rashes or petechiae noted. Psych: Normal affect. Lymphatics: No cervical, calvicular, axillary or inguinal LAD.   LAB RESULTS:  Lab Results  Component Value Date   NA 138 11/06/2017   K 4.7 11/06/2017   CL 101 11/06/2017   CO2 26 11/06/2017   GLUCOSE 239 (H) 11/06/2017   BUN 23 (H) 11/06/2017   CREATININE 1.00 11/06/2017   CALCIUM 9.1 11/06/2017   PROT 7.2 11/06/2017   ALBUMIN 3.7 11/06/2017   AST 32 11/06/2017  ALT 17 11/06/2017   ALKPHOS 93 11/06/2017   BILITOT 0.4 11/06/2017   GFRNONAA 51 (L) 11/06/2017   GFRAA 60 (L) 11/06/2017    Lab Results  Component Value Date   WBC 11.5 (H) 11/06/2017   NEUTROABS 4.7 11/06/2017   HGB 12.3 11/06/2017   HCT 38.3 11/06/2017   MCV 91.1 11/06/2017   PLT 361 11/06/2017     STUDIES: Nm Pet Image Restag (ps) Skull Base To Thigh  Result Date: 10/30/2017 CLINICAL DATA:  Subsequent treatment strategy for B-cell lymphoma. EXAM: NUCLEAR MEDICINE PET SKULL BASE TO THIGH TECHNIQUE: Thirteen mCi F-18 FDG was injected intravenously. Full-ring PET imaging was performed from the skull base to thigh after the radiotracer. CT data was obtained and used for attenuation correction and anatomic localization. FASTING BLOOD GLUCOSE:  Value: 117 mg/dl COMPARISON:  07/23/2017 FINDINGS: NECK: No hypermetabolic lymph nodes in the neck. CHEST: Right hilar hypermetabolism persists with SUV max = 4.4 today compared to 5.4 previously. The index right lower paratracheal lymph node measured 8 mm on the previous study is stable at 8 mm today. Background blood pool activity on today's study is SUV max = 2.3. Heart is enlarged. Similar appearance of chronic underlying interstitial coarsening with subpleural reticulation suggesting a fibrotic component. ABDOMEN/PELVIS: No abnormal hypermetabolic activity within the liver, pancreas, adrenal glands, or spleen. No hypermetabolic lymph nodes in the abdomen or  pelvis. Index right external iliac node measured previously 1.2 cm now measures 1.1 cm in shows no hypermetabolic FDG accumulation. 1.2 cm left external iliac lymph node is stable at 1.2 cm with SUV max = 1.2 Gallbladder surgically absent. There is abdominal aortic atherosclerosis without aneurysm. Left colonic diverticulosis without diverticulitis. SKELETON: No focal hypermetabolic activity to suggest skeletal metastasis. IMPRESSION: 1. Stable exam without new or progressive disease. 2. The indistinct right hilar lymph node identified on the previous study remains hypermetabolic today with similar standard uptake value. Borderline to mildly enlarged left pelvic sidewall lymph nodes are not substantially changed and show no hypermetabolic FDG accumulation above background blood pool/soft tissue levels. 3. Similar appearance chronic underlying interstitial changes in the lungs. Electronically Signed   By: Misty Stanley M.D.   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  PLAN:    1. 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 The collective findings a post-germinal center small lymphocytic infiltrate obscuring reactive germinal centers with evidence of lambda light chain restriction is characteristic of a marginal zone lymphoma. Localization of the lesion in the eyelid area is compatible with a marginal zone lymphoma of the ocular adnexa. This is a rare diagnosis representing 1-2% of all non-Hodgkin's lymphoma.  She completed treatment with XRT only in October 2018.  PET scan results from October 30, 2017 reviewed independently and reported as above with no obvious evidence of disease.  Right hilar lymph node is still present, but has a decreased SUV.  She does not require bone marrow biopsy at this time, but would consider one if she had definitive progression of disease.  No intervention is  needed at this time.  Return to clinic in 6 months with MRI of her orbits and CT of her chest.  Approximately 20 minutes was spent in discussion of which greater than 50% was consultation.   Patient expressed understanding and was in agreement with this plan. She also understands that She can call clinic at any time with  any questions, concerns, or complaints.   Cancer Staging Extranodal marginal zone B-cell lymphoma (HCC) Staging form: Hodgkin and Non-Hodgkin Lymphoma, AJCC 8th Edition - Clinical stage from 07/31/2017: Stage IE (Marginal zone lymphoma) - Signed by Lloyd Huger, MD on 07/31/2017   Lloyd Huger, MD   11/06/2017 10:07 AM

## 2017-11-05 ENCOUNTER — Other Ambulatory Visit: Payer: Self-pay | Admitting: *Deleted

## 2017-11-05 DIAGNOSIS — I10 Essential (primary) hypertension: Secondary | ICD-10-CM | POA: Diagnosis not present

## 2017-11-05 DIAGNOSIS — E782 Mixed hyperlipidemia: Secondary | ICD-10-CM | POA: Diagnosis not present

## 2017-11-05 DIAGNOSIS — C8590 Non-Hodgkin lymphoma, unspecified, unspecified site: Secondary | ICD-10-CM

## 2017-11-05 DIAGNOSIS — I480 Paroxysmal atrial fibrillation: Secondary | ICD-10-CM | POA: Diagnosis not present

## 2017-11-06 ENCOUNTER — Inpatient Hospital Stay: Payer: Medicare HMO

## 2017-11-06 ENCOUNTER — Inpatient Hospital Stay: Payer: Medicare HMO | Attending: Oncology | Admitting: Oncology

## 2017-11-06 VITALS — BP 105/69 | HR 89 | Temp 97.1°F | Resp 18 | Wt 154.3 lb

## 2017-11-06 DIAGNOSIS — Z85828 Personal history of other malignant neoplasm of skin: Secondary | ICD-10-CM

## 2017-11-06 DIAGNOSIS — C884 Extranodal marginal zone B-cell lymphoma of mucosa-associated lymphoid tissue [MALT-lymphoma]: Secondary | ICD-10-CM | POA: Diagnosis not present

## 2017-11-06 DIAGNOSIS — N189 Chronic kidney disease, unspecified: Secondary | ICD-10-CM | POA: Insufficient documentation

## 2017-11-06 DIAGNOSIS — Z9221 Personal history of antineoplastic chemotherapy: Secondary | ICD-10-CM

## 2017-11-06 DIAGNOSIS — K219 Gastro-esophageal reflux disease without esophagitis: Secondary | ICD-10-CM | POA: Diagnosis not present

## 2017-11-06 DIAGNOSIS — Z79899 Other long term (current) drug therapy: Secondary | ICD-10-CM | POA: Insufficient documentation

## 2017-11-06 DIAGNOSIS — I129 Hypertensive chronic kidney disease with stage 1 through stage 4 chronic kidney disease, or unspecified chronic kidney disease: Secondary | ICD-10-CM | POA: Insufficient documentation

## 2017-11-06 DIAGNOSIS — Z853 Personal history of malignant neoplasm of breast: Secondary | ICD-10-CM | POA: Diagnosis not present

## 2017-11-06 DIAGNOSIS — Z923 Personal history of irradiation: Secondary | ICD-10-CM

## 2017-11-06 DIAGNOSIS — Z87442 Personal history of urinary calculi: Secondary | ICD-10-CM | POA: Diagnosis not present

## 2017-11-06 DIAGNOSIS — Z9011 Acquired absence of right breast and nipple: Secondary | ICD-10-CM | POA: Diagnosis not present

## 2017-11-06 DIAGNOSIS — H409 Unspecified glaucoma: Secondary | ICD-10-CM | POA: Insufficient documentation

## 2017-11-06 DIAGNOSIS — Z7982 Long term (current) use of aspirin: Secondary | ICD-10-CM | POA: Insufficient documentation

## 2017-11-06 DIAGNOSIS — I700 Atherosclerosis of aorta: Secondary | ICD-10-CM | POA: Diagnosis not present

## 2017-11-06 DIAGNOSIS — C8590 Non-Hodgkin lymphoma, unspecified, unspecified site: Secondary | ICD-10-CM

## 2017-11-06 DIAGNOSIS — E785 Hyperlipidemia, unspecified: Secondary | ICD-10-CM | POA: Diagnosis not present

## 2017-11-06 LAB — CBC WITH DIFFERENTIAL/PLATELET
BASOS ABS: 0.1 10*3/uL (ref 0–0.1)
BASOS PCT: 1 %
EOS ABS: 0.7 10*3/uL (ref 0–0.7)
Eosinophils Relative: 6 %
HEMATOCRIT: 38.3 % (ref 35.0–47.0)
HEMOGLOBIN: 12.3 g/dL (ref 12.0–16.0)
Lymphocytes Relative: 45 %
Lymphs Abs: 5.3 10*3/uL — ABNORMAL HIGH (ref 1.0–3.6)
MCH: 29.4 pg (ref 26.0–34.0)
MCHC: 32.3 g/dL (ref 32.0–36.0)
MCV: 91.1 fL (ref 80.0–100.0)
Monocytes Absolute: 0.7 10*3/uL (ref 0.2–0.9)
Monocytes Relative: 7 %
NEUTROS ABS: 4.7 10*3/uL (ref 1.4–6.5)
NEUTROS PCT: 41 %
Platelets: 361 10*3/uL (ref 150–440)
RBC: 4.2 MIL/uL (ref 3.80–5.20)
RDW: 14.8 % — ABNORMAL HIGH (ref 11.5–14.5)
WBC: 11.5 10*3/uL — AB (ref 3.6–11.0)

## 2017-11-06 LAB — COMPREHENSIVE METABOLIC PANEL
ALK PHOS: 93 U/L (ref 38–126)
ALT: 17 U/L (ref 14–54)
ANION GAP: 11 (ref 5–15)
AST: 32 U/L (ref 15–41)
Albumin: 3.7 g/dL (ref 3.5–5.0)
BILIRUBIN TOTAL: 0.4 mg/dL (ref 0.3–1.2)
BUN: 23 mg/dL — ABNORMAL HIGH (ref 6–20)
CALCIUM: 9.1 mg/dL (ref 8.9–10.3)
CO2: 26 mmol/L (ref 22–32)
CREATININE: 1 mg/dL (ref 0.44–1.00)
Chloride: 101 mmol/L (ref 101–111)
GFR, EST AFRICAN AMERICAN: 60 mL/min — AB (ref >60)
GFR, EST NON AFRICAN AMERICAN: 51 mL/min — AB (ref >60)
Glucose, Bld: 239 mg/dL — ABNORMAL HIGH (ref 65–99)
Potassium: 4.7 mmol/L (ref 3.5–5.1)
SODIUM: 138 mmol/L (ref 135–145)
TOTAL PROTEIN: 7.2 g/dL (ref 6.5–8.1)

## 2017-11-06 LAB — LACTATE DEHYDROGENASE: LDH: 146 U/L (ref 98–192)

## 2017-11-06 NOTE — Addendum Note (Signed)
Addended by: Luretha Murphy on: 11/06/2017 12:50 PM   Modules accepted: Orders

## 2017-11-06 NOTE — Progress Notes (Signed)
Patient presents today for follow up, has no complaints

## 2017-11-10 DIAGNOSIS — I480 Paroxysmal atrial fibrillation: Secondary | ICD-10-CM | POA: Diagnosis not present

## 2017-11-10 DIAGNOSIS — I10 Essential (primary) hypertension: Secondary | ICD-10-CM | POA: Diagnosis not present

## 2017-12-03 NOTE — Progress Notes (Signed)
12/04/2017 9:34 AM   Sandy Hickman 1933/12/01 [id number]  Referring provider: Ezequiel Kayser, MD [place] [facility] [place], [place] [address]  Chief Complaint  Patient presents with  . Recurrent UTI    HPI: Patient is an 84 year old Caucasian female with a history of recurrent UTI's and vaginal atrophy who presents today for symptoms of an UTI.  Background history Patient is a 77 -year-old Caucasian female who is referred to [place] by, [facility], for recurrent urinary tract infections.  Patient states that she has had three urinary tract infections over the last few months.  Her symptoms with a urinary tract infection consist of pelvic pressure, urgency and frequency and dysuria.  She denies back pain, abdominal pain or flank pain.  She has not had any recent fevers, chills, nausea or vomiting.  She does have a history of nephrolithiasis and GU surgery.  Reviewing her records,  she has had three documented urinary tract infections.  Two for pan-sensitive E.coli and one for Klebsiella.   She is not sexually active.  She is post menopausal.  She admits to diarrhea on occasions.   She does engage in good perineal hygiene. She does not take tub baths.   She does not have incontinence.  She is not having pain with bladder filling.  She underwent contrast CT in 07/2015 and kidneys and bladder were unremarkable.  I have independently reviewed the films.  She is not drinking a lot of water daily.  She does admit to drinking sweet tea and coffee.  She does drink cranberry juice.  She is not experiencing symptoms of an urinary tract infection at this time.  UA most likely contaminated.  PVR is 0 mL.    RUS performed on 12/21/2016 noted no hydronephrosis nor evidence of echogenic shadowing stones.  No definite bladder abnormality but the images of the bladder limited by partial distention.    She states that on Tuesday (12/01/2017) she started to experience dysuria, urgency, frequency  and urge incontinence.  She feels that her bladder is full and has a very strong urge to urinate.  An attempt to CATH resulted in no return of urine, so patient is currently drinking water.  She has not had any gross hematuria or flank pain.  She denied any fevers, chills, nausea or vomiting.     We were able to get some urine from a CATH specimen after she drank some water.  Her CATH UA was positive for [facility] WBC's.     PMH: Past Medical History:  Diagnosis Date  . Allergy   . Arrhythmia   . Arthritis   . Breast cancer ([place]) 2006   rt mastectomy  . Chronic kidney disease    chronic uti, kidney stones  . GERD (gastroesophageal reflux disease)   . Glaucoma   . HLD (hyperlipidemia)   . Hypertension   . 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 Personal history of chemotherapy   . Personal history of radiation therapy   . 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 ABDOMINAL HYSTERECTOMY    . BLADDER SUSPENSION     15 years ago  . BREAST SURGERY    . cataract surgery Bilateral   . CHOLECYSTECTOMY    . JOINT REPLACEMENT    . KNEE SURGERY Right    2016  . MASTECTOMY Right 2006  rt mast /rad/chemo  . ROTATOR CUFF REPAIR     13 years    Home Medications:  Allergies as of 12/04/2017      Reactions   Neosporin [neomycin-bacitracin Zn-polymyx] Dermatitis   Sulfa Antibiotics Itching, Rash   Enalapril Other (See Comments)   Oxycodone-acetaminophen Itching      Medication List        Accurate as of 12/04/17  9:34 AM. Always use your most recent med list.          amLODipine 2.5 MG tablet Commonly known as:  NORVASC TAKE 1 TABLET (2.5 MG TOTAL) BY MOUTH ONCE DAILY. FOR BLOOD PRESSURE   ascorbic acid 500 MG tablet Commonly known as:  VITAMIN C Take 500 mg by mouth daily.   aspirin 81 MG tablet Take 81 mg by mouth daily.   azelastine 0.1 % nasal spray Commonly known as:   ASTELIN Place into the nose.   CENTRUM SILVER PO Take 1 each by mouth daily.   ciprofloxacin 250 MG tablet Commonly known as:  CIPRO Take 1 tablet (250 mg total) by mouth 2 (two) times daily.   clobetasol cream 0.05 % Commonly known as:  TEMOVATE Apply 1 application topically 2 (two) times daily as needed.   cyanocobalamin 500 MCG tablet Take 500 mcg by mouth daily.   ELIQUIS 5 MG Tabs tablet Generic drug:  apixaban Take 5 mg by mouth 2 (two) times daily.   ezetimibe 10 MG tablet Commonly known as:  ZETIA Take 10 mg by mouth daily.   FISH OIL PO Take 500 mg by mouth daily.   gabapentin 300 MG capsule Commonly known as:  NEURONTIN Take 300 mg by mouth. 1-2 capsules nightly as directed   gemfibrozil 600 MG tablet Commonly known as:  LOPID Take 600 mg by mouth daily.   latanoprost 0.005 % ophthalmic solution Commonly known as:  XALATAN latanoprost 0.005 % eye drops  USE 1 DROP(S) IN BOTH EYES ONCE IN THE EVENINGS   loratadine 10 MG tablet Commonly known as:  CLARITIN Take 10 mg by mouth daily as needed.   LORazepam 1 MG tablet Commonly known as:  ATIVAN Take 1 tablet (1 mg total) by mouth as directed.   losartan 100 MG tablet Commonly known as:  COZAAR Take 100 mg by mouth daily.   Melatonin 10 MG Tabs Take by mouth.   methenamine 1 g tablet Commonly known as:  HIPREX Take 1 tablet (1 g total) by mouth 2 (two) times daily with a meal.   metoprolol succinate 25 MG 24 hr tablet Commonly known as:  TOPROL-XL Take 25 mg by mouth daily.   mirtazapine 15 MG tablet Commonly known as:  REMERON Take 15 mg by mouth at bedtime.   omeprazole 20 MG capsule Commonly known as:  PRILOSEC Take 20 mg by mouth daily.   ondansetron 4 MG tablet Commonly known as:  ZOFRAN Take 1 tablet (4 mg total) by mouth every 6 (six) hours.   PROBIOTIC ACIDOPHILUS PO Take 1 Dose by mouth daily.   Timolol Maleate PF 0.25 % Soln Apply 1 drop to eye daily.       Allergies:   Allergies  Allergen Reactions  . Neosporin [Neomycin-Bacitracin Zn-Polymyx] Dermatitis  . Sulfa Antibiotics Itching and Rash  . Enalapril Other (See Comments)  . Oxycodone-Acetaminophen Itching    Family History: Family History  Problem Relation Age of Onset  . Breast cancer Daughter 55  . Hypertension Father   . Stroke Father   .  Diabetes Sister   . Breast cancer Cousin        pat cousin  . Kidney cancer Son   . Prostate cancer Neg Hx   . Bladder Cancer Neg Hx     Social History:  reports that  has never smoked. she has never used smokeless tobacco. She reports that she drinks alcohol. She reports that she does not use drugs.  ROS: UROLOGY Frequent Urination?: Yes Hard to postpone urination?: No Burning/pain with urination?: Yes Get up at night to urinate?: Yes Leakage of urine?: No Urine stream starts and stops?: No Trouble starting stream?: No Do you have to strain to urinate?: No Blood in urine?: No Urinary tract infection?: Yes Sexually transmitted disease?: No Injury to kidneys or bladder?: No Painful intercourse?: No Weak stream?: No Currently pregnant?: No Vaginal bleeding?: No Last menstrual period?: n  Gastrointestinal Nausea?: No Vomiting?: No Indigestion/heartburn?: No Diarrhea?: No Constipation?: No  Constitutional Fever: No Night sweats?: No Weight loss?: No Fatigue?: No  Skin Skin rash/lesions?: No Itching?: No  Eyes Blurred vision?: No Double vision?: No  Ears/Nose/Throat Sore throat?: No Sinus problems?: Yes  Hematologic/Lymphatic Swollen glands?: No Easy bruising?: No  Cardiovascular Leg swelling?: No Chest pain?: No  Respiratory Cough?: No Shortness of breath?: No  Endocrine Excessive thirst?: No  Musculoskeletal Back pain?: No Joint pain?: No  Neurological Headaches?: No Dizziness?: No  Psychologic Depression?: No Anxiety?: No  Physical Exam: BP (!) 152/68   Pulse 70   Ht 5\' 5"  (1.651 m)   Wt 155  lb (70.3 kg)   BMI 25.79 kg/m   Constitutional: Well nourished. Alert and oriented, No acute distress. HEENT: [place] AT, moist mucus membranes. Trachea midline, no masses. Cardiovascular: No clubbing, cyanosis, or edema. Respiratory: Normal respiratory effort, no increased work of breathing. Skin: No rashes, bruises or suspicious lesions. Lymph: No cervical or inguinal adenopathy. Neurologic: Grossly intact, no focal deficits, moving all 4 extremities. Psychiatric: Normal mood and affect.   Laboratory Data: Lab Results  Component Value Date   WBC 11.5 (H) 11/06/2017   HGB 12.3 11/06/2017   HCT 38.3 11/06/2017   MCV 91.1 11/06/2017   PLT 361 11/06/2017    Lab Results  Component Value Date   CREATININE 1.00 11/06/2017    Lab Results  Component Value Date   AST 32 11/06/2017   Lab Results  Component Value Date   ALT 17 11/06/2017   I have reviewed the labs  Assessment & Plan:    1. Recurrent UTI's  - CATH UA is suspicious for infection - will send for culture - will start Cipro 250 bid at this time as patient is symptomatic and office may be closed due to inclement weather on Monday  - reviewed UTI prevention strategies with the patient - strongly encouraged her to drink water  - patient insurance will not cover Hiprex - will start antibiotic suppression with trimethoprim 100 mg qd x 3 months once her infection has cleared   2. Vaginal atrophy  - not a candidate for estrogen vaginal cream due to diagnosis of breast cancer  - advised the patient to use vaginal probiotics and coconut/olive oil for discomfort                               Return for pemding labs.  These notes generated with voice recognition software. I apologize for typographical errors.  Zara Council, PA-C  [facility]  [address] [place], [place] [address] [phone]

## 2017-12-04 ENCOUNTER — Ambulatory Visit (INDEPENDENT_AMBULATORY_CARE_PROVIDER_SITE_OTHER): Payer: Medicare HMO | Admitting: Urology

## 2017-12-04 ENCOUNTER — Other Ambulatory Visit
Admission: RE | Admit: 2017-12-04 | Discharge: 2017-12-04 | Disposition: A | Discharge status: Home / Self Care | Payer: Medicare HMO | Source: Ambulatory Visit | Attending: Urology | Admitting: Urology

## 2017-12-04 ENCOUNTER — Encounter: Payer: Self-pay | Admitting: Urology

## 2017-12-04 VITALS — BP 152/68 | HR 70 | Ht 65.0 in | Wt 155.0 lb

## 2017-12-04 DIAGNOSIS — N390 Urinary tract infection, site not specified: Secondary | ICD-10-CM | POA: Diagnosis not present

## 2017-12-04 DIAGNOSIS — N952 Postmenopausal atrophic vaginitis: Secondary | ICD-10-CM

## 2017-12-04 LAB — URINALYSIS, COMPLETE (UACMP) WITH MICROSCOPIC
BILIRUBIN URINE: NEGATIVE
Glucose, UA: NEGATIVE mg/dL
KETONES UR: NEGATIVE mg/dL
NITRITE: NEGATIVE
Protein, ur: 30 mg/dL — AB
Specific Gravity, Urine: 1.025 (ref 1.005–1.030)
Squamous Epithelial / LPF: NONE SEEN
pH: 6 (ref 5.0–8.0)

## 2017-12-04 MED ORDER — CIPROFLOXACIN HCL 250 MG PO TABS: 250.0000 mg | ORAL_TABLET | Freq: Two times a day (BID) | ORAL | 0 refills | Status: DC

## 2017-12-04 NOTE — Progress Notes (Signed)
In and Out Catheterization  Patient is present today for a I & O catheterization due to recurrent UTI, dysuria. Patient was cleaned and prepped in a sterile fashion with betadine and Lidocaine 2% jelly was instilled into the urethra.  A 14FR cath was inserted no complications were noted , 37ml of urine return was noted, urine was yellow in color. A clean urine sample was collected for culture. Bladder was drained  And catheter was removed with out difficulty.    Preformed by: Fonnie Jarvis, CMA

## 2017-12-06 LAB — URINE CULTURE: Culture: >=100000 — AB

## 2017-12-07 ENCOUNTER — Telehealth: Payer: Self-pay

## 2017-12-07 NOTE — Telephone Encounter (Signed)
Patient notified

## 2017-12-07 NOTE — Telephone Encounter (Signed)
-----   Message from Nori Riis, PA-C sent at 12/06/2017  3:04 PM EST ----- Please let Mrs. Granzow know that her urine culture was positive for infection and the bacteria is sensitive to the Cipro.

## 2017-12-13 ENCOUNTER — Other Ambulatory Visit: Payer: Self-pay | Admitting: Urology

## 2017-12-13 MED ORDER — TRIMETHOPRIM 100 MG PO TABS: 100.0000 mg | ORAL_TABLET | Freq: Every day | ORAL | 0 refills | Status: DC

## 2017-12-13 NOTE — Progress Notes (Signed)
Trimethoprim sent to pharmacy.

## 2017-12-25 ENCOUNTER — Ambulatory Visit: Payer: Medicare HMO | Admitting: Urology

## 2018-01-22 ENCOUNTER — Ambulatory Visit: Payer: Medicare HMO | Admitting: Urology

## 2018-01-29 ENCOUNTER — Other Ambulatory Visit: Payer: Self-pay

## 2018-01-29 ENCOUNTER — Ambulatory Visit (INDEPENDENT_AMBULATORY_CARE_PROVIDER_SITE_OTHER): Payer: Medicare HMO | Admitting: Urology

## 2018-01-29 ENCOUNTER — Telehealth: Payer: Self-pay

## 2018-01-29 ENCOUNTER — Other Ambulatory Visit
Admission: RE | Admit: 2018-01-29 | Discharge: 2018-01-29 | Disposition: A | Discharge status: Home / Self Care | Payer: Medicare HMO | Source: Ambulatory Visit | Attending: Urology | Admitting: Urology

## 2018-01-29 ENCOUNTER — Encounter: Payer: Self-pay | Admitting: Urology

## 2018-01-29 VITALS — BP 143/67 | HR 65 | Ht 65.0 in | Wt 154.0 lb

## 2018-01-29 DIAGNOSIS — N390 Urinary tract infection, site not specified: Secondary | ICD-10-CM

## 2018-01-29 LAB — URINALYSIS, COMPLETE (UACMP) WITH MICROSCOPIC
Glucose, UA: NEGATIVE mg/dL
HGB URINE DIPSTICK: NEGATIVE
Nitrite: NEGATIVE
PH: 5.5 (ref 5.0–8.0)
Protein, ur: 100 mg/dL — AB

## 2018-01-29 MED ORDER — TRIMETHOPRIM 100 MG PO TABS: 100.0000 mg | ORAL_TABLET | Freq: Every day | ORAL | 0 refills | Status: DC

## 2018-01-29 NOTE — Telephone Encounter (Signed)
-----   Message from Nori Riis, PA-C sent at 01/29/2018  3:49 PM EST ----- Please let Mrs. Shawn know that her UA did have pus in it.   There may not have been enough urine for a culture.  If the lab can culture the urine, then let's culture, but hold on prescribing an antibiotic until she becomes symptomatic.  If there isn't enough urine to culture, I suggest the patient contact the office if she should become symptomatic so we can CATH her and get an urine culture.

## 2018-01-29 NOTE — Telephone Encounter (Signed)
Spoke with pt in reference to u/a results. Pt stated that she is not currently having UTI s/s. Reinforced with pt if she develops them to call [facility] office for a cath specimen. Pt voiced understanding.

## 2018-01-29 NOTE — Progress Notes (Signed)
01/29/2018 10:16 AM   Sandy Hickman 10/25/1934 [id number]  Referring provider: Ezequiel Kayser, MD [facility] [place], [place] [address]  Chief Complaint  Patient presents with  . Urinary Tract Infection  . Follow-up    HPI: Patient is an 84 year old Caucasian female with a history of recurrent UTI's and vaginal atrophy who presents today for symptoms of an UTI.  Background history Patient is a 6 -year-old Caucasian female who is referred to [place] by, [facility], for recurrent urinary tract infections.  Patient states that she has had three urinary tract infections over the last few months.  Her symptoms with a urinary tract infection consist of pelvic pressure, urgency and frequency and dysuria.  She denies back pain, abdominal pain or flank pain.  She has not had any recent fevers, chills, nausea or vomiting.  She does have a history of nephrolithiasis and GU surgery.  Reviewing her records,  she has had three documented urinary tract infections.  Two for pan-sensitive E.coli and one for Klebsiella.   She is not sexually active.  She is post menopausal.  She admits to diarrhea on occasions.   She does engage in good perineal hygiene. She does not take tub baths.   She does not have incontinence.  She is not having pain with bladder filling.  She underwent contrast CT in 07/2015 and kidneys and bladder were unremarkable.  I have independently reviewed the films.  She is not drinking a lot of water daily.  She does admit to drinking sweet tea and coffee.  She does drink cranberry juice.  She is not experiencing symptoms of an urinary tract infection at this time.  UA most likely contaminated.  PVR is 0 mL.    RUS performed on 12/21/2016 noted no hydronephrosis nor evidence of echogenic shadowing stones.  No definite bladder abnormality but the images of the bladder limited by partial distention.    She states that on Tuesday (12/01/2017) she started to experience  dysuria, urgency, frequency and urge incontinence.  She feels that her bladder is full and has a very strong urge to urinate.  An attempt to CATH resulted in no return of urine, so patient is currently drinking water.  She has not had any gross hematuria or flank pain.  She denied any fevers, chills, nausea or vomiting.     We were able to get some urine from a CATH specimen after she drank some water.  Her CATH UA was positive for [facility] WBC's.  Her urine culture was positive for E.Coli that was pan sensitive.    Today, she states she is not having symptoms of an UTI.  Patient denies any gross hematuria, dysuria or suprapubic/flank pain.  Patient denies any fevers, chills, nausea or vomiting.   Since her last UTI on 12/04/2017, she has not had symptoms of an UTI.  Her CATH UA is positive for [facility] WBC's.     PMH: Past Medical History:  Diagnosis Date  . Allergy   . Arrhythmia   . Arthritis   . Breast cancer ([place]) 2006   rt mastectomy  . Chronic kidney disease    chronic uti, kidney stones  . GERD (gastroesophageal reflux disease)   . Glaucoma   . HLD (hyperlipidemia)   . Hypertension   . 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 Personal history of chemotherapy   . Personal history of radiation therapy   . 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 ABDOMINAL HYSTERECTOMY    . BLADDER SUSPENSION     15 years ago  . BREAST SURGERY    . cataract surgery Bilateral   . CHOLECYSTECTOMY    . JOINT REPLACEMENT    . KNEE SURGERY Right    2016  . MASTECTOMY Right 2006   rt mast /rad/chemo  . ROTATOR CUFF REPAIR     13 years    Home Medications:  Allergies as of 01/29/2018      Reactions   Neosporin [neomycin-bacitracin Zn-polymyx] Dermatitis   Sulfa Antibiotics Itching, Rash   Enalapril Other (See Comments)   Oxycodone-acetaminophen Itching      Medication List        Accurate as of  01/29/18 10:16 AM. Always use your most recent med list.          amLODipine 2.5 MG tablet Commonly known as:  NORVASC TAKE 1 TABLET (2.5 MG TOTAL) BY MOUTH ONCE DAILY. FOR BLOOD PRESSURE   ascorbic acid 500 MG tablet Commonly known as:  VITAMIN C Take 500 mg by mouth daily.   aspirin 81 MG tablet Take 81 mg by mouth daily.   azelastine 0.1 % nasal spray Commonly known as:  ASTELIN Place into the nose.   CENTRUM SILVER PO Take 1 each by mouth daily.   clobetasol cream 0.05 % Commonly known as:  TEMOVATE Apply 1 application topically 2 (two) times daily as needed.   cyanocobalamin 500 MCG tablet Take 500 mcg by mouth daily.   ELIQUIS 5 MG Tabs tablet Generic drug:  apixaban Take 5 mg by mouth 2 (two) times daily.   ezetimibe 10 MG tablet Commonly known as:  ZETIA Take 10 mg by mouth daily.   FISH OIL PO Take 500 mg by mouth daily.   gabapentin 300 MG capsule Commonly known as:  NEURONTIN Take 300 mg by mouth. 1-2 capsules nightly as directed   gemfibrozil 600 MG tablet Commonly known as:  LOPID Take 600 mg by mouth daily.   latanoprost 0.005 % ophthalmic solution Commonly known as:  XALATAN latanoprost 0.005 % eye drops  USE 1 DROP(S) IN BOTH EYES ONCE IN THE EVENINGS   loratadine 10 MG tablet Commonly known as:  CLARITIN Take 10 mg by mouth daily as needed.   LORazepam 1 MG tablet Commonly known as:  ATIVAN Take 1 tablet (1 mg total) by mouth as directed.   losartan 100 MG tablet Commonly known as:  COZAAR Take 100 mg by mouth daily.   Melatonin 10 MG Tabs Take by mouth.   methenamine 1 g tablet Commonly known as:  HIPREX Take 1 tablet (1 g total) by mouth 2 (two) times daily with a meal.   metoprolol succinate 25 MG 24 hr tablet Commonly known as:  TOPROL-XL Take 25 mg by mouth daily.   mirtazapine 15 MG tablet Commonly known as:  REMERON Take 15 mg by mouth at bedtime.   omeprazole 20 MG capsule Commonly known as:  PRILOSEC Take 20 mg  by mouth daily.   ondansetron 4 MG tablet Commonly known as:  ZOFRAN Take 1 tablet (4 mg total) by mouth every 6 (six) hours.   PROBIOTIC ACIDOPHILUS PO Take 1 Dose by mouth daily.   Timolol Maleate PF 0.25 % Soln Apply 1 drop to eye daily.   trimethoprim 100 MG tablet Commonly known as:  TRIMPEX Take 1 tablet (100  mg total) by mouth daily.       Allergies:  Allergies  Allergen Reactions  . Neosporin [Neomycin-Bacitracin Zn-Polymyx] Dermatitis  . Sulfa Antibiotics Itching and Rash  . Enalapril Other (See Comments)  . Oxycodone-Acetaminophen Itching    Family History: Family History  Problem Relation Age of Onset  . Breast cancer Daughter 33  . Hypertension Father   . Stroke Father   . Diabetes Sister   . Breast cancer Cousin        pat cousin  . Kidney cancer Son   . Prostate cancer Neg Hx   . Bladder Cancer Neg Hx     Social History:  reports that  has never smoked. she has never used smokeless tobacco. She reports that she drinks alcohol. She reports that she does not use drugs.  ROS: UROLOGY Frequent Urination?: No Hard to postpone urination?: No Burning/pain with urination?: No Get up at night to urinate?: No Leakage of urine?: No Urine stream starts and stops?: No Trouble starting stream?: No Do you have to strain to urinate?: No Blood in urine?: No Urinary tract infection?: No Sexually transmitted disease?: No Injury to kidneys or bladder?: No Painful intercourse?: No Weak stream?: No Currently pregnant?: No Vaginal bleeding?: No Last menstrual period?: n  Gastrointestinal Nausea?: No Vomiting?: No Indigestion/heartburn?: No Diarrhea?: No Constipation?: No  Constitutional Fever: No Night sweats?: No Weight loss?: No Fatigue?: No  Skin Skin rash/lesions?: No Itching?: No  Eyes Blurred vision?: No Double vision?: No  Ears/Nose/Throat Sore throat?: No Sinus problems?: No  Hematologic/Lymphatic Swollen glands?: No Easy  bruising?: No  Cardiovascular Leg swelling?: No Chest pain?: No  Respiratory Cough?: No Shortness of breath?: No  Endocrine Excessive thirst?: No  Musculoskeletal Back pain?: No Joint pain?: No  Neurological Headaches?: No Dizziness?: No  Psychologic Depression?: No Anxiety?: No  Physical Exam: BP (!) 143/67   Pulse 65   Ht 5\' 5"  (1.651 m)   Wt 154 lb (69.9 kg)   BMI 25.63 kg/m   Constitutional: Well nourished. Alert and oriented, No acute distress. HEENT: [place] AT, moist mucus membranes. Trachea midline, no masses. Cardiovascular: No clubbing, cyanosis, or edema. Respiratory: Normal respiratory effort, no increased work of breathing. Skin: No rashes, bruises or suspicious lesions. Lymph: No cervical or inguinal adenopathy. Neurologic: Grossly intact, no focal deficits, moving all 4 extremities. Psychiatric: Normal mood and affect.   Laboratory Data: Lab Results  Component Value Date   WBC 11.5 (H) 11/06/2017   HGB 12.3 11/06/2017   HCT 38.3 11/06/2017   MCV 91.1 11/06/2017   PLT 361 11/06/2017    Lab Results  Component Value Date   CREATININE 1.00 11/06/2017    Lab Results  Component Value Date   AST 32 11/06/2017   Lab Results  Component Value Date   ALT 17 11/06/2017   I have reviewed the labs  Assessment & Plan:    1. Recurrent UTI's  - CATH UA with TNTC WBC's - patient is asymptomatic, so we will not send for culture  - reviewed UTI prevention strategies with the patient - strongly encouraged her to drink water  - patient insurance will not cover Hiprex and she cannot use vaginal estrogen cream - she will continue the suppression with trimethoprim 100 mg qd x 3 months and then discontinue the antibiotic  - asked her to contact us for any breakthrough infections  -Return to clinic in 1 year for OAB questionnaire and PVR  2. Vaginal atrophy  - not  a candidate for estrogen vaginal cream due to diagnosis of breast cancer  - advised the  patient to use vaginal probiotics and coconut/olive oil for discomfort                               Return in about 1 year (around 01/30/2019) for PVR and OAB questionnaire.  These notes generated with voice recognition software. I apologize for typographical errors.  Zara Council, [facility] [address] [place], [place] [address] [phone]

## 2018-03-05 ENCOUNTER — Other Ambulatory Visit: Payer: Self-pay | Admitting: Internal Medicine

## 2018-03-05 DIAGNOSIS — Z1231 Encounter for screening mammogram for malignant neoplasm of breast: Secondary | ICD-10-CM

## 2018-03-08 ENCOUNTER — Other Ambulatory Visit: Payer: Self-pay

## 2018-03-08 ENCOUNTER — Ambulatory Visit
Admission: RE | Admit: 2018-03-08 | Discharge: 2018-03-08 | Disposition: A | Discharge status: Home / Self Care | Payer: Medicare HMO | Source: Ambulatory Visit | Attending: Radiation Oncology | Admitting: Radiation Oncology

## 2018-03-08 ENCOUNTER — Encounter: Payer: Self-pay | Admitting: Radiation Oncology

## 2018-03-08 VITALS — BP 162/78 | Temp 96.8°F | Resp 20 | Wt 155.5 lb

## 2018-03-08 DIAGNOSIS — Z8572 Personal history of non-Hodgkin lymphomas: Secondary | ICD-10-CM | POA: Insufficient documentation

## 2018-03-08 DIAGNOSIS — Z08 Encounter for follow-up examination after completed treatment for malignant neoplasm: Secondary | ICD-10-CM | POA: Diagnosis not present

## 2018-03-08 DIAGNOSIS — Z923 Personal history of irradiation: Secondary | ICD-10-CM | POA: Diagnosis not present

## 2018-03-08 DIAGNOSIS — C884 Extranodal marginal zone B-cell lymphoma of mucosa-associated lymphoid tissue [MALT-lymphoma]: Secondary | ICD-10-CM

## 2018-03-08 NOTE — Progress Notes (Signed)
Radiation Oncology Follow up Note  Name: Sandy Hickman   Date:   03/08/2018 MRN:  [id number] DOB: 10/08/1934    This 84 y.o. female presents to the clinic today for 6 month follow-up status post radiation therapy to her left supraorbital ridge for extranodal marginal cell lymphoma.  REFERRING PROVIDER: Ezequiel Kayser, MD  HPI: patient is an 84 year old female now out 6 months having completed electron beam radiation therapy to her left supraorbital ridge for extranodal marginal zone lymphoma. Seen today in routine follow-up she is doing well.She's been specifically denies fever chills night sweats or any new areas of nodularity..she had a PET CT scan back in December 2018 showing stable exam with no progressive or new disease. She has an indistinct right hilar lymph node which remains hypermetabolic on that study. She scheduled for MRI scan of her orbits as well CT scan of her chest in June 2019. She states her vision is excellent and she has been to see her ophthalmologist.  COMPLICATIONS OF TREATMENT: none  FOLLOW UP COMPLIANCE: keeps appointments   PHYSICAL EXAM:  BP (!) 162/78   Temp (!) 96.8 F (36 C)   Resp 20   Wt 155 lb 8.6 oz (70.5 kg)   BMI 25.88 kg/m  No evidence of nodularity or mass in the supraorbital region bilaterally is noted. No preauricular sub-digastric cervical or supraclavicular adenopathy is identified. Crude visual fields are within normal range. Well-developed well-nourished patient in NAD. HEENT reveals PERLA, EOMI, discs not visualized.  Oral cavity is clear. No oral mucosal lesions are identified. Neck is clear without evidence of cervical or supraclavicular adenopathy. Lungs are clear to A&P. Cardiac examination is essentially unremarkable with regular rate and rhythm without murmur rub or thrill. Abdomen is benign with no organomegaly or masses noted. Motor sensory and DTR levels are equal and symmetric in the upper and lower extremities. Cranial nerves II  through XII are grossly intact. Proprioception is intact. No peripheral adenopathy or edema is identified. No motor or sensory levels are noted. Crude visual fields are within normal range. RADIOLOGY RESULTS: PET CT scan is reviewed and compatible with the above-stated findings  PLAN: present time she is doing well with no evidence of disease. I would like to see her back in 6 months for follow-up and then will start once your follow-up visits. She is doing well with no smoking side effects. Patient also: Anytime with any concerns.  I would like to take this opportunity to thank you for allowing me to participate in the care of your patient.Noreene Filbert, MD

## 2018-03-25 ENCOUNTER — Ambulatory Visit
Admission: RE | Admit: 2018-03-25 | Discharge: 2018-03-25 | Disposition: A | Discharge status: Home / Self Care | Payer: Medicare HMO | Source: Ambulatory Visit | Attending: Internal Medicine | Admitting: Internal Medicine

## 2018-03-25 DIAGNOSIS — Z1231 Encounter for screening mammogram for malignant neoplasm of breast: Secondary | ICD-10-CM | POA: Diagnosis present

## 2018-05-02 NOTE — Progress Notes (Signed)
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  INTERVAL HISTORY: Patient returns to clinic today for for routine follow-up and discussion of her imaging results.  She continues to feel well and remains asymptomatic. She has no neurologic complaints. She has no visual complaints. She has a good appetite and denies weight loss. She denies any fevers, and night sweats, or weight loss. She has no chest pain or shortness of breath. She denies any nausea, vomiting, constipation, or diarrhea. She has no urinary complaints.  Patient offers no specific complaints today.  REVIEW OF SYSTEMS:   Review of Systems  Constitutional: Negative.  Negative for diaphoresis, fever, malaise/fatigue and weight loss.  Eyes: Negative.  Negative for blurred vision, double vision and pain.  Respiratory: Negative.  Negative for cough and shortness of breath.   Cardiovascular: Negative.  Negative for chest pain and leg swelling.  Gastrointestinal: Negative.  Negative for abdominal pain.  Genitourinary: Negative.   Musculoskeletal: Negative.   Skin: Negative.  Negative for rash.  Neurological: Negative.  Negative for sensory change, focal weakness and weakness.  Psychiatric/Behavioral: Negative.  The patient is not nervous/anxious.     As per HPI. Otherwise, a complete review of systems is negative.  PAST MEDICAL HISTORY: Past Medical History:  Diagnosis Date  . Allergy   . Arrhythmia   . Arthritis   . Breast cancer ([place]) 2006   rt mastectomy  . Chronic kidney disease    chronic uti, kidney stones  . GERD (gastroesophageal reflux disease)   . Glaucoma   . HLD (hyperlipidemia)   . Hypertension   . Marginal zone lymphoma ([place])    left orbit  . Personal  history of chemotherapy   . Personal history of radiation therapy   . 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 ABDOMINAL HYSTERECTOMY    . BLADDER SUSPENSION     15 years ago  . BREAST SURGERY    . cataract surgery Bilateral   . CHOLECYSTECTOMY    . JOINT REPLACEMENT    . KNEE SURGERY Right    2016  . MASTECTOMY Right 2006   rt mast /rad/chemo  . ROTATOR CUFF REPAIR     13 years    FAMILY HISTORY: Family History  Problem Relation Age of Onset  . Breast cancer Daughter 48  . Hypertension Father   . Stroke Father   . Diabetes Sister   . Breast cancer Cousin        pat cousin  . Kidney cancer Son   . Prostate cancer Neg Hx   . Bladder Cancer Neg Hx     ADVANCED DIRECTIVES (Y/N):  N  HEALTH MAINTENANCE: Social History   Tobacco Use  . Smoking status: Never Smoker  . Smokeless tobacco: Never Used  Substance Use Topics  . Alcohol use: Yes    Comment: Occasional  . Drug use: No     Colonoscopy:  PAP:  Bone density:  Lipid panel:  Allergies  Allergen Reactions  . Neosporin [Neomycin-Bacitracin Zn-Polymyx] Dermatitis  . Sulfa Antibiotics Itching and Rash  . Enalapril Other (See Comments)  . Oxycodone-Acetaminophen Itching    Current Outpatient Medications  Medication Sig Dispense Refill  .  apixaban (ELIQUIS) 5 MG TABS tablet Take 5 mg by mouth 2 (two) times daily.    Marland Kitchen ascorbic acid (VITAMIN C) 500 MG tablet Take 500 mg by mouth daily.    . Cranberry 500 MG CAPS Take by mouth.     . cyanocobalamin 500 MCG tablet Take 500 mcg by mouth daily.     Marland Kitchen ezetimibe (ZETIA) 10 MG tablet     . ferrous sulfate 325 (65 FE) MG tablet Take 325 mg by mouth daily with breakfast.     . gabapentin (NEURONTIN) 300 MG capsule Take 300 mg by mouth at bedtime. 1-2 capsules nightly as directed    . gemfibrozil (LOPID) 600 MG tablet Take 600 mg by mouth daily.    . Lactobacillus  (PROBIOTIC ACIDOPHILUS PO) Take 1 Dose by mouth daily.     Marland Kitchen latanoprost (XALATAN) 0.005 % ophthalmic solution latanoprost 0.005 % eye drops  USE 1 DROP(S) IN BOTH EYES ONCE IN THE EVENINGS    . loratadine (CLARITIN) 10 MG tablet Take 10 mg by mouth daily as needed.     Marland Kitchen LORazepam (ATIVAN) 1 MG tablet TAKE 1 TABLET (1 MG TOTAL) BY MOUTH ONCE FOR 1 DOSE. TAKE 1 TABLET 30 MINUTES PRIOR TO PROCEDURE.  0  . losartan (COZAAR) 100 MG tablet Take 100 mg by mouth daily.    . Melatonin 10 MG TABS Take by mouth.     . methenamine (HIPREX) 1 g tablet Take 1 tablet (1 g total) by mouth 2 (two) times daily with a meal. 60 tablet 3  . metoprolol succinate (TOPROL-XL) 25 MG 24 hr tablet Take 25 mg by mouth daily.    . mirtazapine (REMERON) 15 MG tablet Take 15 mg by mouth at bedtime.     . Multiple Vitamins-Minerals (CENTRUM SILVER PO) Take 1 each by mouth daily.    . Omega-3 Fatty Acids (FISH OIL PO) Take 500 mg by mouth daily.     . Timolol Maleate PF 0.25 % SOLN Apply 1 drop to eye daily.    Marland Kitchen trimethoprim (TRIMPEX) 100 MG tablet Take 1 tablet (100 mg total) by mouth daily. 90 tablet 0  . ezetimibe (ZETIA) 10 MG tablet Take 10 mg by mouth daily.     Marland Kitchen omeprazole (PRILOSEC) 20 MG capsule Take 20 mg by mouth daily.    . ondansetron (ZOFRAN) 4 MG tablet Take 1 tablet (4 mg total) by mouth every 6 (six) hours. (Patient not taking: Reported on 05/07/2018) 12 tablet 0   No current facility-administered medications for this visit.     OBJECTIVE: Vitals:   05/07/18 1406  BP: (!) 172/77  Pulse: 66  Resp: 18  Temp: 97.9 F (36.6 C)     Body mass index is 25.99 kg/m.    ECOG FS:0 - Asymptomatic  General: Well-developed, well-nourished, no acute distress. Eyes: Pink conjunctiva, anicteric sclera. Lungs: Clear to auscultation bilaterally. Heart: Regular rate and rhythm. No rubs, murmurs, or gallops. Abdomen: Soft, nontender, nondistended. No organomegaly noted, normoactive bowel sounds. Musculoskeletal:  No edema, cyanosis, or clubbing. Neuro: Alert, answering all questions appropriately. Cranial nerves grossly intact. Skin: No rashes or petechiae noted. Psych: Normal affect. Lymphatics: No cervical, calvicular, axillary or inguinal LAD.   LAB RESULTS:  Lab Results  Component Value Date   NA 139 05/04/2018   K 4.3 05/04/2018   CL 102 05/04/2018   CO2 24 05/04/2018   GLUCOSE 164 (H) 05/04/2018   BUN 17 05/04/2018   CREATININE 0.82 05/04/2018  CALCIUM 9.4 05/04/2018   PROT 7.2 11/06/2017   ALBUMIN 3.7 11/06/2017   AST 32 11/06/2017   ALT 17 11/06/2017   ALKPHOS 93 11/06/2017   BILITOT 0.4 11/06/2017   GFRNONAA >60 05/04/2018   GFRAA >60 05/04/2018    Lab Results  Component Value Date   WBC 9.9 05/04/2018   NEUTROABS 4.0 05/04/2018   HGB 12.4 05/04/2018   HCT 37.9 05/04/2018   MCV 91.1 05/04/2018   PLT 275 05/04/2018     STUDIES: Ct Chest W Contrast  Result Date: 05/04/2018 CLINICAL DATA:  84 year old female with history of marginal zone B-cell lymphoma. Followup evaluation. EXAM: CT CHEST WITH CONTRAST TECHNIQUE: Multidetector CT imaging of the chest was performed during intravenous contrast administration. CONTRAST:  38m ISOVUE-300 IOPAMIDOL (ISOVUE-300) INJECTION 61% COMPARISON:  PET-CT 10/30/2017. FINDINGS: Cardiovascular: Heart size is borderline enlarged. There is no significant pericardial fluid, thickening or pericardial calcification. Aortic atherosclerosis. No coronary artery calcifications. Mediastinum/Nodes: No pathologically enlarged mediastinal, internal mammary or hilar lymph nodes. Esophagus is unremarkable in appearance. No axillary lymphadenopathy. Status post right axillary lymph node dissection. Lungs/Pleura: In the periphery of the right upper lobe immediately deep to the right breast there are fibrotic changes, likely reflective of post radiation changes. Similar findings are present to a greater extent in the apex of the right upper lobe deep to the  right axilla, also likely post radiation changes. No acute consolidative airspace disease. No pleural effusions. No suspicious appearing pulmonary nodules or masses are noted. Upper Abdomen: Status post cholecystectomy. Musculoskeletal: Status post right modified radical mastectomy. There are no aggressive appearing lytic or blastic lesions noted in the visualized portions of the skeleton. IMPRESSION: 1. No findings to suggest lymphoma in the thorax. 2. Postradiation changes in the right lung, as above, presumably related to prior right breast and right axillary radiation therapy in this patient status post right modified radical mastectomy and right axillary lymph node dissection. 3. Aortic atherosclerosis. Aortic Atherosclerosis (ICD10-I70.0). Electronically Signed   By: DVinnie LangtonM.D.   On: 05/04/2018 13:59   Mr ORosealee AlbeeWPNContrast  Result Date: 05/04/2018 CLINICAL DATA:  Extranodal marginal zone B-cell lymphoma. EXAM: MRI OF THE ORBITS WITHOUT AND WITH CONTRAST TECHNIQUE: Multiplanar, multisequence MR imaging of the orbits was performed both before and after the administration of intravenous contrast. CONTRAST:  159mMULTIHANCE GADOBENATE DIMEGLUMINE 529 MG/ML IV SOLN COMPARISON:  Orbital MRI 07/29/2017 FINDINGS: Interval improvement in postsurgical changes left frontal scalp. Small skin defect has improved. Streaky markings in the subcutaneous tissues has improved. Mild enhancement at the surgical site has improved. No recurrent mass lesion. Bilateral cataract removal. No mass involving the globe. Extraocular muscles normal. Optic nerve normal. Optic chiasm normal. The cavernous sinus is normal bilaterally. No orbital mass. Lacrimal gland is normal bilaterally. Limited intracranial imaging demonstrates right lateral frontal meningioma with dense enhancement measuring 16 x 11 mm. This is unchanged from the prior study. IMPRESSION: Improvement in postsurgical changes left frontal scalp. No recurrent  mass lesion. Negative for orbital mass lesion. Right lateral frontal meningioma 16 mm unchanged. Electronically Signed   By: ChFranchot Gallo.D.   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  PLAN:    1. 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 The collective findings of a post-germinal center small lymphocytic infiltrate obscuring reactive germinal centers with evidence of lambda light chain restriction is characteristic of a  marginal zone lymphoma. Localization of the lesion in the eyelid area is compatible with a marginal zone lymphoma of the ocular adnexa. This is a rare diagnosis representing 1-2% of all non-Hodgkin's lymphoma.  She completed treatment with XRT only in October 2018.  PET scan results from October 30, 2017 reviewed independently with no obvious evidence of disease.  CT scan and MRI results from May 04, 2018 reviewed independently and report as above with no evidence of progressive or recurrent disease.  Patient did not require bone marrow biopsy, but would consider one if there is suggestion of progression of disease.  Return to clinic in 6 months with repeat imaging and further evaluation.  I spent a total of 20 minutes face-to-face with the patient of which greater than 50% of the visit was spent in counseling and coordination of care as summarized above.   Patient expressed understanding and was in agreement with this plan. She also understands that She can call clinic at any time with any questions, concerns, or complaints.   Cancer Staging Extranodal marginal zone B-cell lymphoma (HCC) Staging form: Hodgkin and Non-Hodgkin Lymphoma, AJCC 8th Edition - Clinical stage from 07/31/2017: Stage IE (Marginal zone lymphoma) - Signed by Lloyd Huger, MD on 07/31/2017   Lloyd Huger, MD   05/12/2018 6:13 AM

## 2018-05-03 ENCOUNTER — Other Ambulatory Visit: Payer: Self-pay | Admitting: Oncology

## 2018-05-03 ENCOUNTER — Other Ambulatory Visit: Payer: Self-pay | Admitting: *Deleted

## 2018-05-03 DIAGNOSIS — C8580 Other specified types of non-Hodgkin lymphoma, unspecified site: Secondary | ICD-10-CM

## 2018-05-03 MED ORDER — LORAZEPAM 1 MG PO TABS: 1.0000 mg | ORAL_TABLET | Freq: Once | ORAL | 0 refills | Status: AC

## 2018-05-04 ENCOUNTER — Ambulatory Visit
Admission: RE | Admit: 2018-05-04 | Discharge: 2018-05-04 | Disposition: A | Discharge status: Home / Self Care | Payer: Medicare HMO | Source: Ambulatory Visit | Attending: Oncology | Admitting: Oncology

## 2018-05-04 ENCOUNTER — Inpatient Hospital Stay: Payer: Medicare HMO | Attending: Oncology

## 2018-05-04 DIAGNOSIS — I129 Hypertensive chronic kidney disease with stage 1 through stage 4 chronic kidney disease, or unspecified chronic kidney disease: Secondary | ICD-10-CM | POA: Insufficient documentation

## 2018-05-04 DIAGNOSIS — C884 Extranodal marginal zone b-cell lymphoma of mucosa-associated lymphoid tissue (malt-lymphoma) not having achieved remission: Secondary | ICD-10-CM

## 2018-05-04 DIAGNOSIS — Z853 Personal history of malignant neoplasm of breast: Secondary | ICD-10-CM | POA: Insufficient documentation

## 2018-05-04 DIAGNOSIS — Z923 Personal history of irradiation: Secondary | ICD-10-CM | POA: Insufficient documentation

## 2018-05-04 DIAGNOSIS — Z9011 Acquired absence of right breast and nipple: Secondary | ICD-10-CM | POA: Insufficient documentation

## 2018-05-04 DIAGNOSIS — Z85828 Personal history of other malignant neoplasm of skin: Secondary | ICD-10-CM | POA: Insufficient documentation

## 2018-05-04 DIAGNOSIS — Z87442 Personal history of urinary calculi: Secondary | ICD-10-CM | POA: Diagnosis not present

## 2018-05-04 DIAGNOSIS — Z79899 Other long term (current) drug therapy: Secondary | ICD-10-CM | POA: Insufficient documentation

## 2018-05-04 DIAGNOSIS — Z01818 Encounter for other preprocedural examination: Secondary | ICD-10-CM | POA: Insufficient documentation

## 2018-05-04 DIAGNOSIS — E785 Hyperlipidemia, unspecified: Secondary | ICD-10-CM | POA: Insufficient documentation

## 2018-05-04 DIAGNOSIS — H409 Unspecified glaucoma: Secondary | ICD-10-CM | POA: Diagnosis not present

## 2018-05-04 DIAGNOSIS — I700 Atherosclerosis of aorta: Secondary | ICD-10-CM | POA: Insufficient documentation

## 2018-05-04 DIAGNOSIS — K219 Gastro-esophageal reflux disease without esophagitis: Secondary | ICD-10-CM | POA: Insufficient documentation

## 2018-05-04 DIAGNOSIS — D320 Benign neoplasm of cerebral meninges: Secondary | ICD-10-CM | POA: Diagnosis not present

## 2018-05-04 DIAGNOSIS — Z9889 Other specified postprocedural states: Secondary | ICD-10-CM | POA: Insufficient documentation

## 2018-05-04 DIAGNOSIS — N189 Chronic kidney disease, unspecified: Secondary | ICD-10-CM | POA: Insufficient documentation

## 2018-05-04 LAB — CBC WITH DIFFERENTIAL/PLATELET
BASOS PCT: 1 %
Basophils Absolute: 0 10*3/uL (ref 0–0.1)
EOS ABS: 0.6 10*3/uL (ref 0–0.7)
EOS PCT: 6 %
HCT: 37.9 % (ref 35.0–47.0)
Hemoglobin: 12.4 g/dL (ref 12.0–16.0)
LYMPHS PCT: 46 %
Lymphs Abs: 4.7 10*3/uL — ABNORMAL HIGH (ref 1.0–3.6)
MCH: 29.8 pg (ref 26.0–34.0)
MCHC: 32.7 g/dL (ref 32.0–36.0)
MCV: 91.1 fL (ref 80.0–100.0)
Monocytes Absolute: 0.6 10*3/uL (ref 0.2–0.9)
Monocytes Relative: 6 %
Neutro Abs: 4 10*3/uL (ref 1.4–6.5)
Neutrophils Relative %: 41 %
PLATELETS: 275 10*3/uL (ref 150–440)
RBC: 4.16 MIL/uL (ref 3.80–5.20)
RDW: 14.9 % — AB (ref 11.5–14.5)
WBC: 9.9 10*3/uL (ref 3.6–11.0)

## 2018-05-04 LAB — BASIC METABOLIC PANEL
Anion gap: 13 (ref 5–15)
BUN: 17 mg/dL (ref 6–20)
CHLORIDE: 102 mmol/L (ref 101–111)
CO2: 24 mmol/L (ref 22–32)
Calcium: 9.4 mg/dL (ref 8.9–10.3)
Creatinine, Ser: 0.82 mg/dL (ref 0.44–1.00)
GFR calc Af Amer: >60 mL/min (ref >60)
GFR calc non Af Amer: >60 mL/min (ref >60)
GLUCOSE: 164 mg/dL — AB (ref 65–99)
POTASSIUM: 4.3 mmol/L (ref 3.5–5.1)
Sodium: 139 mmol/L (ref 135–145)

## 2018-05-04 MED ORDER — IOPAMIDOL (ISOVUE-300) INJECTION 61%
75.0000 mL | Freq: Once | INTRAVENOUS | Status: AC | PRN
  Administered 2018-05-04: 75 mL via INTRAVENOUS

## 2018-05-04 MED ORDER — GADOBENATE DIMEGLUMINE 529 MG/ML IV SOLN
14.0000 mL | Freq: Once | INTRAVENOUS | Status: AC | PRN
  Administered 2018-05-04: 14 mL via INTRAVENOUS

## 2018-05-07 ENCOUNTER — Inpatient Hospital Stay (HOSPITAL_BASED_OUTPATIENT_CLINIC_OR_DEPARTMENT_OTHER): Payer: Medicare HMO | Admitting: Oncology

## 2018-05-07 ENCOUNTER — Other Ambulatory Visit: Payer: Self-pay

## 2018-05-07 VITALS — BP 172/77 | HR 66 | Temp 97.9°F | Resp 18 | Wt 156.2 lb

## 2018-05-07 DIAGNOSIS — Z923 Personal history of irradiation: Secondary | ICD-10-CM

## 2018-05-07 DIAGNOSIS — Z79899 Other long term (current) drug therapy: Secondary | ICD-10-CM

## 2018-05-07 DIAGNOSIS — Z9011 Acquired absence of right breast and nipple: Secondary | ICD-10-CM

## 2018-05-07 DIAGNOSIS — C884 Extranodal marginal zone b-cell lymphoma of mucosa-associated lymphoid tissue (malt-lymphoma) not having achieved remission: Secondary | ICD-10-CM

## 2018-05-07 DIAGNOSIS — Z853 Personal history of malignant neoplasm of breast: Secondary | ICD-10-CM

## 2018-05-07 NOTE — Progress Notes (Signed)
Overall doing well. Anxious today for results of testing .

## 2018-06-11 ENCOUNTER — Other Ambulatory Visit: Payer: Self-pay

## 2018-06-11 MED ORDER — TRIMETHOPRIM 100 MG PO TABS: 100.0000 mg | ORAL_TABLET | Freq: Every day | ORAL | 0 refills | Status: DC

## 2018-08-20 IMAGING — US US BREAST*L* LIMITED INC AXILLA
1 series · 2 of 2 positions shown · non-contrast
Comparison: Previous exam(s).

CLINICAL DATA: Screening recall for a possible left breast mass.

EXAM:
2D DIGITAL DIAGNOSTIC UNILATERAL LEFT MAMMOGRAM WITH CAD AND ADJUNCT
TOMO
LEFT BREAST ULTRASOUND

[Series 1: us breast*left* limited inc axilla · 0.08mm/px · 2 of 2 slices shown]
[im 1/2]
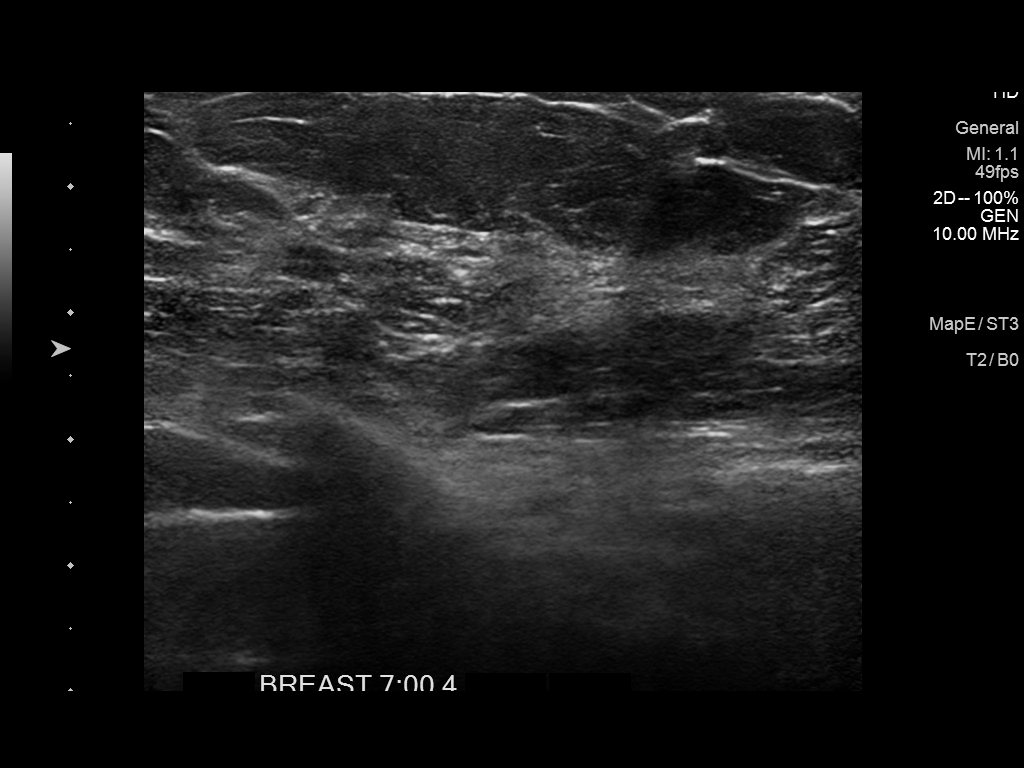
[im 2/2]
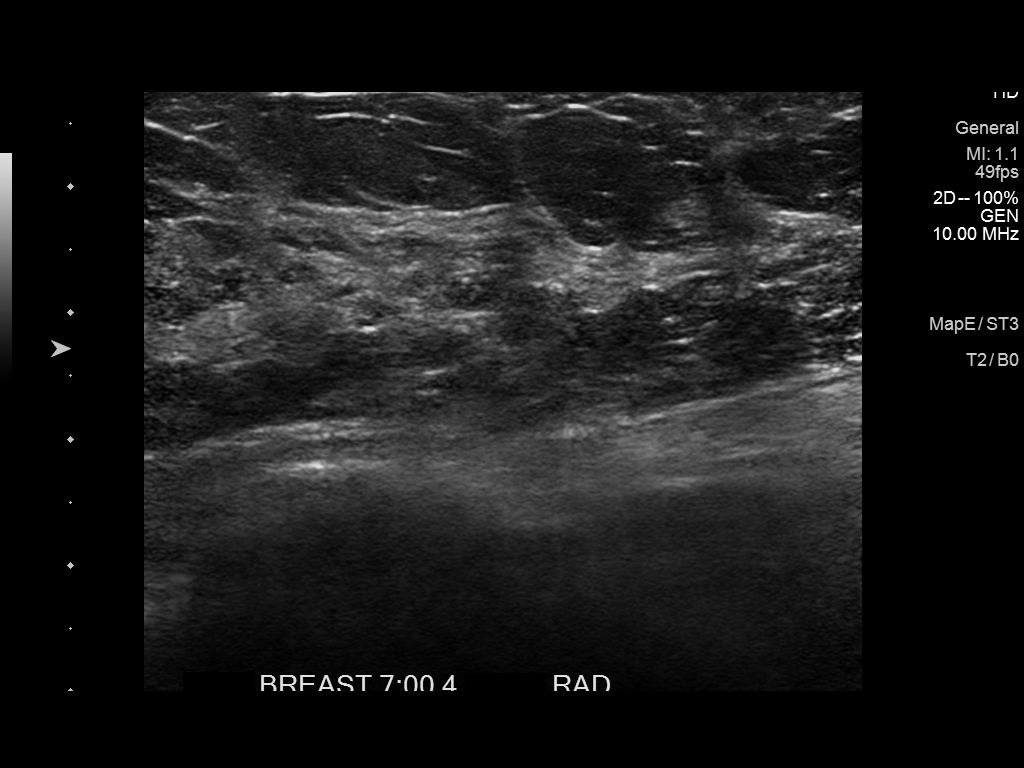

[2 of 2 positions shown; findings below may reference images not displayed]

ACR Breast Density Category c: The breast tissue is heterogeneously
dense, which may obscure small masses.
FINDINGS: Multiple additional images targeted to the lower-inner quadrant of
the left breast demonstrates no definite persistent abnormality at
the site of concern on the screening mammogram. However, due to the
density of the breast tissue in this location, ultrasound will be
performed for further evaluation.

Mammographic images were processed with CAD.

Physical exam of the lower-inner left breast demonstrates no
discrete palpable masses.

Ultrasound targeted to the lower-inner left breast demonstrates
normal fibroglandular tissue. No masses or suspicious areas of
shadowing are identified.
IMPRESSION: There are no mammographic or targeted sonographic abnormalities in
the lower-inner quadrant of the left breast.

RECOMMENDATION:
Screening mammogram in one year.(Code:86-C-IVA)

I have discussed the findings and recommendations with the patient.
Results were also provided in writing at the conclusion of the
visit. If applicable, a reminder letter will be sent to the patient
regarding the next appointment.

BI-RADS CATEGORY  1: Negative.

## 2018-08-27 ENCOUNTER — Other Ambulatory Visit: Payer: Self-pay | Admitting: Urology

## 2018-09-09 ENCOUNTER — Encounter: Payer: Self-pay | Admitting: Radiation Oncology

## 2018-09-09 ENCOUNTER — Other Ambulatory Visit: Payer: Self-pay

## 2018-09-09 ENCOUNTER — Ambulatory Visit
Admission: RE | Admit: 2018-09-09 | Discharge: 2018-09-09 | Disposition: A | Discharge status: Home / Self Care | Payer: Medicare HMO | Source: Ambulatory Visit | Attending: Radiation Oncology | Admitting: Radiation Oncology

## 2018-09-09 VITALS — BP 124/76 | HR 93 | Temp 97.1°F | Wt 153.6 lb

## 2018-09-09 DIAGNOSIS — Z923 Personal history of irradiation: Secondary | ICD-10-CM | POA: Diagnosis not present

## 2018-09-09 DIAGNOSIS — C884 Extranodal marginal zone B-cell lymphoma of mucosa-associated lymphoid tissue [MALT-lymphoma]: Secondary | ICD-10-CM

## 2018-09-09 DIAGNOSIS — Z8572 Personal history of non-Hodgkin lymphomas: Secondary | ICD-10-CM | POA: Diagnosis not present

## 2018-09-09 NOTE — Progress Notes (Signed)
Radiation Oncology Follow up Note  Name: Sandy Hickman   Date:   09/09/2018 MRN:  [id number] DOB: July 24, 1934    This 84 y.o. female presents to the clinic today for 1 year follow-up status post external beam radiation therapy for a extranodal marginal zone lymphoma (MALT)tumor of the left supraorbital ridge  REFERRING PROVIDER: Ezequiel Kayser, MD  HPI: patient is a 84 year old female now seen out 1 year having completed external beam radiation therapy to her left orbit supraorbital ridge for extranodal marginal cell lymphoma (MALT) tumor. Seen today in routine follow-up she is doing well. She specifically denies any change in visual fields ocular pain or any new areas of enlarged lymph nodes..MR of the orbits back in June showed no evidence of disease PET CT scan back in December 2018 also showed no evidence of disease.  COMPLICATIONS OF TREATMENT: none  FOLLOW UP COMPLIANCE: keeps appointments   PHYSICAL EXAM:  BP 124/76 (BP Location: Left Arm, Patient Position: Sitting)   Pulse 93   Temp (!) 97.1 F (36.2 C) (Tympanic)   Wt 153 lb 8.8 oz (69.7 kg)   BMI 25.55 kg/m  Crude visual fields are within normal range does have bilateral cataracts. No evidence of periorbital swelling no evidence of cervical or supraclavicular adenopathy is identifiedWell-developed well-nourished patient in NAD. HEENT reveals PERLA, EOMI, discs not visualized.  Oral cavity is clear. No oral mucosal lesions are identified. Neck is clear without evidence of cervical or supraclavicular adenopathy. Lungs are clear to A&P. Cardiac examination is essentially unremarkable with regular rate and rhythm without murmur rub or thrill. Abdomen is benign with no organomegaly or masses noted. Motor sensory and DTR levels are equal and symmetric in the upper and lower extremities. Cranial nerves II through XII are grossly intact. Proprioception is intact. No peripheral adenopathy or edema is identified. No motor or sensory levels  are noted. Crude visual fields are within normal range.  RADIOLOGY RESULTS: MRI and PET scan are reviewed and compatible with the above-stated findings  PLAN: present time she continues to do well with no evidence of disease 1 year out from external beam radiation therapy to her left eye. I'm please were overall progress. I've asked to see her back in 1 year for follow-up. She is a rescheduled for follow-up MRI scan of her orbits in December 2019. I will review those when it becomes available. Patient knows to call with any concerns at any time.    Noreene Filbert, MD

## 2018-09-27 ENCOUNTER — Inpatient Hospital Stay
Admit: 2018-09-27 | Discharge: 2018-09-27 | Disposition: A | Discharge status: Home / Self Care | Payer: Medicare HMO | Attending: Internal Medicine | Admitting: Internal Medicine

## 2018-09-27 ENCOUNTER — Inpatient Hospital Stay: Admit: 2018-09-27 | Payer: Medicare HMO

## 2018-09-27 ENCOUNTER — Other Ambulatory Visit: Payer: Self-pay

## 2018-09-27 ENCOUNTER — Inpatient Hospital Stay
Admission: EM | Admit: 2018-09-27 | Discharge: 2018-10-01 | DRG: 264 | Disposition: A | Discharge status: Home / Self Care | Payer: Medicare HMO | Attending: Specialist | Admitting: Specialist

## 2018-09-27 ENCOUNTER — Emergency Department: Payer: Medicare HMO

## 2018-09-27 ENCOUNTER — Inpatient Hospital Stay: Payer: Medicare HMO

## 2018-09-27 DIAGNOSIS — Z7901 Long term (current) use of anticoagulants: Secondary | ICD-10-CM

## 2018-09-27 DIAGNOSIS — N289 Disorder of kidney and ureter, unspecified: Secondary | ICD-10-CM

## 2018-09-27 DIAGNOSIS — N182 Chronic kidney disease, stage 2 (mild): Secondary | ICD-10-CM | POA: Diagnosis present

## 2018-09-27 DIAGNOSIS — N179 Acute kidney failure, unspecified: Secondary | ICD-10-CM | POA: Diagnosis present

## 2018-09-27 DIAGNOSIS — R160 Hepatomegaly, not elsewhere classified: Secondary | ICD-10-CM | POA: Diagnosis not present

## 2018-09-27 DIAGNOSIS — M549 Dorsalgia, unspecified: Secondary | ICD-10-CM | POA: Diagnosis not present

## 2018-09-27 DIAGNOSIS — Z515 Encounter for palliative care: Secondary | ICD-10-CM | POA: Diagnosis not present

## 2018-09-27 DIAGNOSIS — C801 Malignant (primary) neoplasm, unspecified: Secondary | ICD-10-CM | POA: Diagnosis present

## 2018-09-27 DIAGNOSIS — Z66 Do not resuscitate: Secondary | ICD-10-CM | POA: Diagnosis present

## 2018-09-27 DIAGNOSIS — I248 Other forms of acute ischemic heart disease: Secondary | ICD-10-CM

## 2018-09-27 DIAGNOSIS — Z90710 Acquired absence of both cervix and uterus: Secondary | ICD-10-CM | POA: Diagnosis not present

## 2018-09-27 DIAGNOSIS — Z8572 Personal history of non-Hodgkin lymphomas: Secondary | ICD-10-CM

## 2018-09-27 DIAGNOSIS — Z9011 Acquired absence of right breast and nipple: Secondary | ICD-10-CM | POA: Diagnosis not present

## 2018-09-27 DIAGNOSIS — E785 Hyperlipidemia, unspecified: Secondary | ICD-10-CM | POA: Diagnosis present

## 2018-09-27 DIAGNOSIS — T461X5A Adverse effect of calcium-channel blockers, initial encounter: Secondary | ICD-10-CM | POA: Diagnosis not present

## 2018-09-27 DIAGNOSIS — D72829 Elevated white blood cell count, unspecified: Secondary | ICD-10-CM | POA: Diagnosis present

## 2018-09-27 DIAGNOSIS — R0600 Dyspnea, unspecified: Secondary | ICD-10-CM

## 2018-09-27 DIAGNOSIS — I4891 Unspecified atrial fibrillation: Secondary | ICD-10-CM

## 2018-09-27 DIAGNOSIS — I5031 Acute diastolic (congestive) heart failure: Secondary | ICD-10-CM | POA: Diagnosis present

## 2018-09-27 DIAGNOSIS — R040 Epistaxis: Secondary | ICD-10-CM | POA: Diagnosis not present

## 2018-09-27 DIAGNOSIS — R296 Repeated falls: Secondary | ICD-10-CM | POA: Diagnosis present

## 2018-09-27 DIAGNOSIS — I952 Hypotension due to drugs: Secondary | ICD-10-CM | POA: Diagnosis not present

## 2018-09-27 DIAGNOSIS — R7989 Other specified abnormal findings of blood chemistry: Secondary | ICD-10-CM

## 2018-09-27 DIAGNOSIS — C884 Extranodal marginal zone b-cell lymphoma of mucosa-associated lymphoid tissue (malt-lymphoma) not having achieved remission: Secondary | ICD-10-CM

## 2018-09-27 DIAGNOSIS — N2889 Other specified disorders of kidney and ureter: Secondary | ICD-10-CM | POA: Diagnosis not present

## 2018-09-27 DIAGNOSIS — W19XXXA Unspecified fall, initial encounter: Secondary | ICD-10-CM

## 2018-09-27 DIAGNOSIS — C8309 Small cell B-cell lymphoma, extranodal and solid organ sites: Secondary | ICD-10-CM | POA: Diagnosis not present

## 2018-09-27 DIAGNOSIS — F411 Generalized anxiety disorder: Secondary | ICD-10-CM

## 2018-09-27 DIAGNOSIS — R590 Localized enlarged lymph nodes: Secondary | ICD-10-CM | POA: Diagnosis present

## 2018-09-27 DIAGNOSIS — I130 Hypertensive heart and chronic kidney disease with heart failure and stage 1 through stage 4 chronic kidney disease, or unspecified chronic kidney disease: Secondary | ICD-10-CM | POA: Diagnosis present

## 2018-09-27 DIAGNOSIS — Z853 Personal history of malignant neoplasm of breast: Secondary | ICD-10-CM | POA: Diagnosis not present

## 2018-09-27 DIAGNOSIS — E872 Acidosis: Secondary | ICD-10-CM | POA: Diagnosis present

## 2018-09-27 DIAGNOSIS — Z923 Personal history of irradiation: Secondary | ICD-10-CM | POA: Diagnosis not present

## 2018-09-27 DIAGNOSIS — C770 Secondary and unspecified malignant neoplasm of lymph nodes of head, face and neck: Secondary | ICD-10-CM | POA: Diagnosis present

## 2018-09-27 DIAGNOSIS — K219 Gastro-esophageal reflux disease without esophagitis: Secondary | ICD-10-CM | POA: Diagnosis present

## 2018-09-27 DIAGNOSIS — H409 Unspecified glaucoma: Secondary | ICD-10-CM | POA: Diagnosis present

## 2018-09-27 DIAGNOSIS — R778 Other specified abnormalities of plasma proteins: Secondary | ICD-10-CM

## 2018-09-27 DIAGNOSIS — Z9221 Personal history of antineoplastic chemotherapy: Secondary | ICD-10-CM | POA: Diagnosis not present

## 2018-09-27 DIAGNOSIS — I2489 Other forms of acute ischemic heart disease: Secondary | ICD-10-CM

## 2018-09-27 DIAGNOSIS — R222 Localized swelling, mass and lump, trunk: Secondary | ICD-10-CM

## 2018-09-27 DIAGNOSIS — Z79899 Other long term (current) drug therapy: Secondary | ICD-10-CM | POA: Diagnosis not present

## 2018-09-27 DIAGNOSIS — G629 Polyneuropathy, unspecified: Secondary | ICD-10-CM | POA: Diagnosis present

## 2018-09-27 HISTORY — DX: Unspecified atrial fibrillation: I48.91

## 2018-09-27 LAB — BLOOD GAS, VENOUS
ACID-BASE DEFICIT: 5.8 mmol/L — AB (ref 0.0–2.0)
Bicarbonate: 19.5 mmol/L — ABNORMAL LOW (ref 20.0–28.0)
FIO2: 0.21
O2 SAT: 81.1 %
PCO2 VEN: 37 mmHg — AB (ref 44.0–60.0)
PH VEN: 7.33 (ref 7.250–7.430)
PO2 VEN: 49 mmHg — AB (ref 32.0–45.0)
Patient temperature: 37

## 2018-09-27 LAB — LACTIC ACID, PLASMA
LACTIC ACID, VENOUS: 3.1 mmol/L — AB (ref 0.5–1.9)
LACTIC ACID, VENOUS: 1.6 mmol/L (ref 0.5–1.9)
Lactic Acid, Venous: 2.1 mmol/L (ref 0.5–1.9)

## 2018-09-27 LAB — CBC WITH DIFFERENTIAL/PLATELET
ABS IMMATURE GRANULOCYTES: 0.38 10*3/uL — AB (ref 0.00–0.07)
BASOS ABS: 0.1 10*3/uL (ref 0.0–0.1)
BASOS PCT: 0 %
Eosinophils Absolute: 0.9 10*3/uL — ABNORMAL HIGH (ref 0.0–0.5)
Eosinophils Relative: 4 %
HCT: 44.9 % (ref 36.0–46.0)
Hemoglobin: 13.9 g/dL (ref 12.0–15.0)
Immature Granulocytes: 2 %
LYMPHS ABS: 1.8 10*3/uL (ref 0.7–4.0)
Lymphocytes Relative: 8 %
MCH: 30 pg (ref 26.0–34.0)
MCHC: 31 g/dL (ref 30.0–36.0)
MCV: 96.8 fL (ref 80.0–100.0)
MONOS PCT: 6 %
Monocytes Absolute: 1.5 10*3/uL — ABNORMAL HIGH (ref 0.1–1.0)
NEUTROS ABS: 18.4 10*3/uL — AB (ref 1.7–7.7)
NRBC: 0 % (ref 0.0–0.2)
Neutrophils Relative %: 80 %
PLATELETS: 177 10*3/uL (ref 150–400)
RBC: 4.64 MIL/uL (ref 3.87–5.11)
RDW: 13.3 % (ref 11.5–15.5)
WBC: 23 10*3/uL — ABNORMAL HIGH (ref 4.0–10.5)

## 2018-09-27 LAB — LIPASE, BLOOD: LIPASE: 28 U/L (ref 11–51)

## 2018-09-27 LAB — COMPREHENSIVE METABOLIC PANEL
ALBUMIN: 2.9 g/dL — AB (ref 3.5–5.0)
ALT: 43 U/L (ref 0–44)
AST: 47 U/L — AB (ref 15–41)
Alkaline Phosphatase: 256 U/L — ABNORMAL HIGH (ref 38–126)
Anion gap: 16 — ABNORMAL HIGH (ref 5–15)
BUN: 47 mg/dL — AB (ref 8–23)
CHLORIDE: 104 mmol/L (ref 98–111)
CO2: 18 mmol/L — AB (ref 22–32)
CREATININE: 1.65 mg/dL — AB (ref 0.44–1.00)
Calcium: 12.9 mg/dL — ABNORMAL HIGH (ref 8.9–10.3)
GFR calc Af Amer: 32 mL/min — ABNORMAL LOW (ref >60)
GFR, EST NON AFRICAN AMERICAN: 28 mL/min — AB (ref >60)
Glucose, Bld: 288 mg/dL — ABNORMAL HIGH (ref 70–99)
POTASSIUM: 4.6 mmol/L (ref 3.5–5.1)
Sodium: 138 mmol/L (ref 135–145)
Total Bilirubin: 1.3 mg/dL — ABNORMAL HIGH (ref 0.3–1.2)
Total Protein: 6.8 g/dL (ref 6.5–8.1)

## 2018-09-27 LAB — TROPONIN I
Troponin I: 0.32 ng/mL (ref <0.03)
Troponin I: 0.82 ng/mL (ref <0.03)

## 2018-09-27 LAB — HEPARIN LEVEL (UNFRACTIONATED): Heparin Unfractionated: 1.74 IU/mL — ABNORMAL HIGH (ref 0.30–0.70)

## 2018-09-27 LAB — PROTIME-INR
INR: 1.21
Prothrombin Time: 15.2 seconds (ref 11.4–15.2)

## 2018-09-27 LAB — GLUCOSE, CAPILLARY: Glucose-Capillary: 186 mg/dL — ABNORMAL HIGH (ref 70–99)

## 2018-09-27 LAB — APTT

## 2018-09-27 LAB — BRAIN NATRIURETIC PEPTIDE: B NATRIURETIC PEPTIDE 5: 697 pg/mL — AB (ref 0.0–100.0)

## 2018-09-27 MED ORDER — FUROSEMIDE 10 MG/ML IJ SOLN
20.0000 mg | Freq: Once | INTRAMUSCULAR | Status: AC
  Administered 2018-09-27: 20 mg via INTRAVENOUS
  Filled 2018-09-27: qty 2

## 2018-09-27 MED ORDER — LORATADINE 10 MG PO TABS: 10.0000 mg | ORAL_TABLET | Freq: Every day | ORAL | Status: DC | PRN

## 2018-09-27 MED ORDER — TIMOLOL MALEATE 0.5 % OP SOLN
1.0000 [drp] | Freq: Every day | OPHTHALMIC | Status: DC
  Administered 2018-09-27 – 2018-10-01 (×5): 1 [drp] via OPHTHALMIC
  Filled 2018-09-27: qty 5

## 2018-09-27 MED ORDER — FUROSEMIDE 10 MG/ML IJ SOLN
20.0000 mg | Freq: Two times a day (BID) | INTRAMUSCULAR | Status: DC
  Administered 2018-09-28: 20 mg via INTRAVENOUS
  Filled 2018-09-27: qty 2

## 2018-09-27 MED ORDER — PROBIOTIC ACIDOPHILUS BIOBEADS PO CAPS: 1.0000 | ORAL_CAPSULE | Freq: Every day | ORAL | Status: DC

## 2018-09-27 MED ORDER — MELATONIN 5 MG PO TABS
ORAL_TABLET | Freq: Every day | ORAL | Status: DC
  Administered 2018-09-28: 10 mg via ORAL
  Filled 2018-09-27 (×3): qty 2

## 2018-09-27 MED ORDER — GEMFIBROZIL 600 MG PO TABS
600.0000 mg | ORAL_TABLET | Freq: Every day | ORAL | Status: DC
  Administered 2018-09-28: 600 mg via ORAL
  Filled 2018-09-27 (×2): qty 1

## 2018-09-27 MED ORDER — ACETAMINOPHEN 650 MG RE SUPP: 650.0000 mg | Freq: Four times a day (QID) | RECTAL | Status: DC | PRN

## 2018-09-27 MED ORDER — METHENAMINE HIPPURATE 1 G PO TABS: 1.0000 g | ORAL_TABLET | Freq: Two times a day (BID) | ORAL | Status: DC

## 2018-09-27 MED ORDER — ACETAMINOPHEN 325 MG PO TABS: 650.0000 mg | ORAL_TABLET | Freq: Four times a day (QID) | ORAL | Status: DC | PRN

## 2018-09-27 MED ORDER — METOPROLOL SUCCINATE ER 25 MG PO TB24
25.0000 mg | ORAL_TABLET | Freq: Every day | ORAL | Status: DC
  Administered 2018-09-27 – 2018-10-01 (×5): 25 mg via ORAL
  Filled 2018-09-27 (×6): qty 1

## 2018-09-27 MED ORDER — GABAPENTIN 300 MG PO CAPS
300.0000 mg | ORAL_CAPSULE | Freq: Every day | ORAL | Status: DC
  Administered 2018-09-28 – 2018-09-30 (×3): 300 mg via ORAL
  Filled 2018-09-27 (×4): qty 1

## 2018-09-27 MED ORDER — HEPARIN (PORCINE) IN NACL 100-0.45 UNIT/ML-% IJ SOLN
1050.0000 [IU]/h | INTRAMUSCULAR | Status: DC
  Administered 2018-09-27: 1000 [IU]/h via INTRAVENOUS
  Administered 2018-09-28: 1150 [IU]/h via INTRAVENOUS
  Filled 2018-09-27 (×2): qty 250

## 2018-09-27 MED ORDER — ONDANSETRON HCL 4 MG PO TABS: 4.0000 mg | ORAL_TABLET | Freq: Four times a day (QID) | ORAL | Status: DC | PRN

## 2018-09-27 MED ORDER — RISAQUAD PO CAPS
1.0000 | ORAL_CAPSULE | Freq: Every day | ORAL | Status: DC
  Administered 2018-09-27 – 2018-09-30 (×4): 1 via ORAL
  Filled 2018-09-27 (×4): qty 1

## 2018-09-27 MED ORDER — DILTIAZEM HCL-DEXTROSE 100-5 MG/100ML-% IV SOLN (PREMIX)
5.0000 mg/h | Freq: Once | INTRAVENOUS | Status: AC
  Administered 2018-09-27: 5 mg/h via INTRAVENOUS
  Filled 2018-09-27: qty 100

## 2018-09-27 MED ORDER — LATANOPROST 0.005 % OP SOLN
1.0000 [drp] | Freq: Every day | OPHTHALMIC | Status: DC
  Administered 2018-09-27 – 2018-09-30 (×4): 1 [drp] via OPHTHALMIC
  Filled 2018-09-27: qty 2.5

## 2018-09-27 MED ORDER — LORAZEPAM 2 MG/ML IJ SOLN
1.0000 mg | Freq: Once | INTRAMUSCULAR | Status: AC
  Administered 2018-09-27: 1 mg via INTRAVENOUS
  Filled 2018-09-27: qty 1

## 2018-09-27 MED ORDER — SODIUM CHLORIDE 0.9 % IV BOLUS
1000.0000 mL | Freq: Once | INTRAVENOUS | Status: AC
  Administered 2018-09-27: 1000 mL via INTRAVENOUS

## 2018-09-27 MED ORDER — DILTIAZEM HCL 25 MG/5ML IV SOLN
10.0000 mg | Freq: Once | INTRAVENOUS | Status: AC
  Administered 2018-09-27: 10 mg via INTRAVENOUS
  Filled 2018-09-27: qty 5

## 2018-09-27 MED ORDER — ZOLPIDEM TARTRATE 5 MG PO TABS
5.0000 mg | ORAL_TABLET | Freq: Every evening | ORAL | Status: DC | PRN
  Administered 2018-09-28: 5 mg via ORAL
  Filled 2018-09-27: qty 1

## 2018-09-27 MED ORDER — DILTIAZEM HCL-DEXTROSE 100-5 MG/100ML-% IV SOLN (PREMIX)
5.0000 mg/h | INTRAVENOUS | Status: DC
  Administered 2018-09-27: 5 mg/h via INTRAVENOUS
  Filled 2018-09-27: qty 100

## 2018-09-27 MED ORDER — ONDANSETRON HCL 4 MG/2ML IJ SOLN: 4.0000 mg | Freq: Four times a day (QID) | INTRAMUSCULAR | Status: DC | PRN

## 2018-09-27 MED ORDER — TRAMADOL HCL 50 MG PO TABS
50.0000 mg | ORAL_TABLET | Freq: Four times a day (QID) | ORAL | Status: DC | PRN
  Administered 2018-09-28 – 2018-09-30 (×2): 50 mg via ORAL
  Filled 2018-09-27 (×2): qty 1

## 2018-09-27 MED ORDER — FERROUS SULFATE 325 (65 FE) MG PO TABS
325.0000 mg | ORAL_TABLET | Freq: Every day | ORAL | Status: DC
  Administered 2018-09-28 – 2018-09-30 (×3): 325 mg via ORAL
  Filled 2018-09-27 (×3): qty 1

## 2018-09-27 MED ORDER — MIRTAZAPINE 15 MG PO TABS
15.0000 mg | ORAL_TABLET | Freq: Every day | ORAL | Status: DC
  Administered 2018-09-28 – 2018-09-30 (×3): 15 mg via ORAL
  Filled 2018-09-27 (×3): qty 1

## 2018-09-27 MED ORDER — EZETIMIBE 10 MG PO TABS
10.0000 mg | ORAL_TABLET | Freq: Every day | ORAL | Status: DC
  Administered 2018-09-27 – 2018-09-28 (×2): 10 mg via ORAL
  Filled 2018-09-27 (×2): qty 1

## 2018-09-27 MED ORDER — TIMOLOL MALEATE PF 0.25 % OP SOLN: 1.0000 [drp] | Freq: Every day | OPHTHALMIC | Status: DC

## 2018-09-27 MED ORDER — VITAMIN C 500 MG PO TABS
500.0000 mg | ORAL_TABLET | Freq: Every day | ORAL | Status: DC
  Administered 2018-09-27 – 2018-09-30 (×4): 500 mg via ORAL
  Filled 2018-09-27 (×4): qty 1

## 2018-09-27 MED ORDER — OMEGA-3-ACID ETHYL ESTERS 1 G PO CAPS
1.0000 g | ORAL_CAPSULE | Freq: Every day | ORAL | Status: DC
  Administered 2018-09-27 – 2018-09-30 (×4): 1 g via ORAL
  Filled 2018-09-27 (×4): qty 1

## 2018-09-27 MED ORDER — VITAMIN B-12 1000 MCG PO TABS
500.0000 ug | ORAL_TABLET | Freq: Every day | ORAL | Status: DC
  Administered 2018-09-27 – 2018-09-30 (×4): 500 ug via ORAL
  Filled 2018-09-27 (×4): qty 1

## 2018-09-27 MED ORDER — PANTOPRAZOLE SODIUM 40 MG PO TBEC
40.0000 mg | DELAYED_RELEASE_TABLET | Freq: Every day | ORAL | Status: DC
  Administered 2018-09-27 – 2018-09-30 (×4): 40 mg via ORAL
  Filled 2018-09-27 (×4): qty 1

## 2018-09-27 MED ORDER — ADULT MULTIVITAMIN W/MINERALS CH
1.0000 | ORAL_TABLET | Freq: Every day | ORAL | Status: DC
  Administered 2018-09-28 – 2018-09-30 (×3): 1 via ORAL
  Filled 2018-09-27 (×4): qty 1

## 2018-09-27 NOTE — ED Notes (Signed)
Pt given gingerale. OK per MD

## 2018-09-27 NOTE — ED Notes (Signed)
Called and spoke with Richardson Landry and informed him of pt's 2nd lactic test result

## 2018-09-27 NOTE — H&P (Signed)
[facility] NAME: Sandy Hickman    MR#:  [id number]  DATE OF BIRTH:  07/21/1934  DATE OF ADMISSION:  09/27/2018  PRIMARY CARE PHYSICIAN: Ezequiel Kayser, MD   REQUESTING/REFERRING PHYSICIAN: Dr. Harvest Dark  CHIEF COMPLAINT:   Chief Complaint  Patient presents with  . Fall    HISTORY OF PRESENT ILLNESS:  Sandy Hickman  is a 84 y.o. 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 Patient states she is usually very independent at home.  Lives by herself, however has been feeling very weak recently with decreased intake for the last 2 weeks.  She woke up one day with not feeling well, upper back pain that was radiating to her chest and the lower back.  Denies any trouble breathing, chest pain or palpitations.  Was started on a prednisone taper for her back pain and was sent to see a physiatrist.  She saw them last week was started on tramadol for pain and recommended MRI of thoracic spine if symptoms do not improve.  She is been having falls from weakness at home, she came to the emergency room after a fall today.  Denies hitting her head or losing consciousness.  She was noted to have a heart rate in her 160s here, requiring oxygen acutely and also dyspnea with chest x-ray showing bibasilar fluid.  She is being admitted for further management of the same.  PAST MEDICAL HISTORY:   Past Medical History:  Diagnosis Date  . Allergy   . Arrhythmia   . Arthritis   . Atrial fibrillation ([place])   . Breast cancer ([place]) 2006   rt mastectomy  . Chronic kidney disease    chronic uti, kidney stones  . GERD (gastroesophageal reflux disease)   .  Glaucoma   . HLD (hyperlipidemia)   . Hypertension   . Marginal zone lymphoma ([place])    left orbit  . Personal history of chemotherapy   . Personal history of radiation therapy   . 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 ABDOMINAL HYSTERECTOMY    . BLADDER SUSPENSION     15 years ago  . BREAST SURGERY    . cataract surgery Bilateral   . CHOLECYSTECTOMY    . JOINT REPLACEMENT    . KNEE SURGERY Right    2016  . MASTECTOMY Right 2006   rt mast /rad/chemo  . ROTATOR CUFF REPAIR     13 years    SOCIAL HISTORY:   Social History   Tobacco Use  . Smoking status: Never Smoker  . Smokeless tobacco: Never Used  Substance Use Topics  . Alcohol use: Yes    Comment: Occasional    FAMILY HISTORY:   Family History  Problem Relation Age of Onset  . Breast cancer Daughter 50  . Hypertension Father   . Stroke Father   . Diabetes Sister   . Breast cancer Cousin        pat cousin  . Kidney cancer Son   . Prostate cancer Neg Hx   . Bladder Cancer Neg Hx     DRUG ALLERGIES:  Allergies  Allergen Reactions  . Neosporin [Neomycin-Bacitracin Zn-Polymyx] Dermatitis  . Sulfa Antibiotics Itching and Rash  . Oxycodone-Acetaminophen Itching    REVIEW OF SYSTEMS:   Review of Systems  Constitutional: Positive for malaise/fatigue. Negative for chills, fever and weight loss.  HENT: Negative for ear discharge, ear pain, hearing loss, nosebleeds and tinnitus.   Eyes: Negative for blurred vision, double vision and photophobia.  Respiratory: Positive for shortness of breath. Negative for cough, hemoptysis and wheezing.   Cardiovascular: Positive for palpitations. Negative for chest pain, orthopnea and leg swelling.  Gastrointestinal: Negative for abdominal pain, constipation, diarrhea, heartburn, melena, nausea and vomiting.  Genitourinary: Negative for dysuria, frequency, hematuria and  urgency.  Musculoskeletal: Positive for back pain and myalgias. Negative for neck pain.  Skin: Negative for rash.  Neurological: Positive for focal weakness. Negative for dizziness, tingling, tremors, sensory change, speech change and headaches.  Endo/Heme/Allergies: Does not bruise/bleed easily.  Psychiatric/Behavioral: Negative for depression.    MEDICATIONS AT HOME:   Prior to Admission medications   Medication Sig Start Date End Date Taking? Authorizing Provider  acidophilus (RISAQUAD) CAPS capsule Take 1 capsule by mouth daily.   Yes [provider]  apixaban (ELIQUIS) 5 MG TABS tablet Take 5 mg by mouth 2 (two) times daily.   Yes [provider]  ascorbic acid (VITAMIN C) 500 MG tablet Take 500 mg by mouth daily.   Yes [provider]  Cranberry 500 MG CAPS Take 500 mg by mouth daily.    Yes [provider]  cyanocobalamin 500 MCG tablet Take 500 mcg by mouth daily.    Yes [provider]  ezetimibe (ZETIA) 10 MG tablet Take 10 mg by mouth daily.    Yes [provider]  ferrous sulfate 325 (65 FE) MG tablet Take 325 mg by mouth daily with breakfast.    Yes [provider]  gabapentin (NEURONTIN) 300 MG capsule Take 300 mg by mouth at bedtime.    Yes [provider]  gemfibrozil (LOPID) 600 MG tablet Take 600 mg by mouth daily.   Yes [provider]  latanoprost (XALATAN) 0.005 % ophthalmic solution Place 1 drop into both eyes at bedtime.    Yes [provider]  loratadine (CLARITIN) 10 MG tablet Take 10 mg by mouth daily as needed for allergies.    Yes [provider]  losartan (COZAAR) 100 MG tablet Take 100 mg by mouth daily.   Yes [provider]  Melatonin 10 MG TABS Take 10 mg by mouth at bedtime.    Yes [provider]  metoprolol succinate (TOPROL-XL) 25 MG 24 hr tablet Take 25 mg by mouth daily.   Yes [provider]  mirtazapine (REMERON) 15 MG tablet  Take 15 mg by mouth at bedtime.  09/09/16  Yes [provider]  Multiple Vitamins-Minerals (CENTRUM SILVER PO) Take 1 tablet by mouth daily.    Yes [provider]  Omega-3 Fatty Acids (FISH OIL) 500 MG CAPS Take 500 mg by mouth daily.   Yes [provider]  omeprazole (PRILOSEC) 20 MG capsule Take 20 mg by mouth daily.   Yes [provider]  predniSONE (STERAPRED UNI-PAK 21 TAB) 10 MG (21) TBPK tablet Take 10-60 mg by mouth See admin instructions. 60 mg daily on day 1, the 50 mg daily on day 2, then 40 mg daily on day 3, then 30 mg daily on day 4, then 20 mg daily on day 5, then 10 mg daily  on day 6, then stop 09/22/18  Yes [provider]  timolol (TIMOPTIC) 0.5 % ophthalmic solution Place 1 drop into both eyes daily.   Yes [provider]  traMADol (ULTRAM) 50 MG tablet Take 50 mg by mouth every 6 (six) hours as needed for moderate pain.    Yes [provider]      VITAL SIGNS:  Blood pressure (!) 98/57, pulse (!) 109, temperature 97.7 F (36.5 C), temperature source Oral, resp. rate (!) 25, height 5\' 5"  (1.651 m), weight 68 kg, SpO2 94 %.  PHYSICAL EXAMINATION:   Physical Exam  GENERAL:  84 y.o.-year-old patient lying in the bed with no acute distress.  EYES: Pupils equal, round, reactive to light and accommodation. No scleral icterus. Extraocular muscles intact.  HEENT: Head atraumatic, normocephalic. Oropharynx and nasopharynx clear.  NECK:  Supple, no jugular venous distention. No thyroid enlargement, no tenderness.  LUNGS: Patient is status post right mastectomy. Normal breath sounds bilaterally, no wheezing, rales,rhonchi or crepitation.  Decreased breath sounds at the bases. No use of accessory muscles of respiration.  CARDIOVASCULAR: S1, S2 normal. No  rubs, or gallops.  2/6 systolic murmur is present. ABDOMEN: Soft, nontender, nondistended. Bowel sounds present. No organomegaly or mass.  EXTREMITIES: No pedal edema,  cyanosis, or clubbing.  NEUROLOGIC: Cranial nerves II through XII are intact. Muscle strength 5/5 in all extremities. Sensation intact. Gait not checked.  Global weakness is noted. PSYCHIATRIC: The patient is alert and oriented x 3.  SKIN: No obvious rash, lesion, or ulcer.  Pallor of skin  LABORATORY PANEL:   CBC Recent Labs  Lab 09/27/18 1337  WBC 23.0*  HGB 13.9  HCT 44.9  PLT 177   ------------------------------------------------------------------------------------------------------------------  Chemistries  Recent Labs  Lab 09/27/18 1337  NA 138  K 4.6  CL 104  CO2 18*  GLUCOSE 288*  BUN 47*  CREATININE 1.65*  CALCIUM 12.9*  AST 47*  ALT 43  ALKPHOS 256*  BILITOT 1.3*   ------------------------------------------------------------------------------------------------------------------  Cardiac Enzymes Recent Labs  Lab 09/27/18 1337  TROPONINI 0.32*   ------------------------------------------------------------------------------------------------------------------  RADIOLOGY:  Dg Thoracic Spine 2 View  Result Date: 09/27/2018 CLINICAL DATA:  Weakness, back pain EXAM: THORACIC SPINE 2 VIEWS COMPARISON:  CT 05/04/2018 FINDINGS: Thoracic alignment is within normal limits. The vertebral body heights are maintained. Questionable erosions on the left side of the T6 and T7 vertebral bodies. IMPRESSION: 1. Questionable erosive change along the left aspect of the T6 and T7 vertebral bodies. CT could be obtained for further evaluation. Electronically Signed   By: Donavan Foil M.D.   On: 09/27/2018 16:53   Dg Lumbar Spine 2-3 Views  Result Date: 09/27/2018 CLINICAL DATA:  Weakness with back pain EXAM: LUMBAR SPINE - 2-3 VIEW COMPARISON:  CT 08/10/2015 FINDINGS: Trace retrolisthesis L2 on L3 and L3 on L4. Moderate severe degenerative changes L3-L4, L1-L2 and L2-L3. Levoscoliosis of the lumbar spine. IMPRESSION: Scoliosis and marked multiple level degenerative change without  acute osseous abnormality. Electronically Signed   By: Donavan Foil M.D.   On: 09/27/2018 17:12   Ct Head Wo Contrast  Result Date: 09/27/2018 CLINICAL DATA:  84 y/o  F; fall on Eliquis. Generalized weakness. EXAM: CT HEAD WITHOUT CONTRAST TECHNIQUE: Contiguous axial images were obtained from the base of the skull through the vertex without intravenous contrast. COMPARISON:  05/04/2018 MRI of the head. FINDINGS: Brain: No evidence of acute infarction, hemorrhage, hydrocephalus, extra-axial collection or mass lesion/mass effect of the brain parenchyma. Stable right lateral  frontal meningioma measuring 16 mm (series 4, image 22). Few stable nonspecific white matter hypodensities compatible with mild chronic microvascular ischemic changes. Stable mild volume loss of the brain. Vascular: Mild calcific atherosclerosis of the carotid siphons. Skull: Normal. Negative for fracture or focal lesion. Sinuses/Orbits: No acute finding. Other: Borderline high-riding jugular bulb. IMPRESSION: 1. No acute intracranial abnormality. 2. Stable mild chronic microvascular ischemic changes and mild volume loss of the brain. 3. Stable right lateral frontal region meningioma. Electronically Signed   By: Kristine Garbe M.D.   On: 09/27/2018 13:57   Dg Chest Portable 1 View  Result Date: 09/27/2018 CLINICAL DATA:  Weakness and fall.  Tachycardia. EXAM: PORTABLE CHEST 1 VIEW COMPARISON:  07/02/2013 radiographs. 09/15/2018 chest radiograph report, but images are not available to view. FINDINGS: The cardiomediastinal silhouette is unremarkable. Interstitial prominence bilaterally noted. Elevation of the RIGHT hemidiaphragm and surgical clips in the RIGHT axillary region again identified. No pleural effusion, pneumothorax or definite airspace disease. No acute bony abnormalities are identified. IMPRESSION: Mild interstitial prominence of uncertain chronicity. Infection or mild edema are considerations if acute. No other  significant abnormalities. Electronically Signed   By: Margarette Canada M.D.   On: 09/27/2018 14:01    EKG:   Orders placed or performed during the hospital encounter of 09/27/18  . ED EKG  . ED EKG  . EKG 12-Lead  . EKG 12-Lead    IMPRESSION AND PLAN:   Sandy Hickman  is a 84 y.o. 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  1.  A. fib with RVR-admit to telemetry, started on Cardizem drip at this time. -Already on Eliquis.  Change to heparin drip in case she needs any procedures. -Cardiology consulted.  2.  Acute congestive heart failure-no prior history.  Now has elevated BNP and crackles on exam chest x-ray showing mild pulmonary edema at the bases. -Could be secondary to increased heart rate. -Start on low-dose Lasix.  Monitor closely her renal function. -Echocardiogram has been ordered.  Cardiology consulted.  3.  Acute renal failure-elevated BUN/creatinine.  However seems to be fluid overloaded on exam.  So started on low-dose Lasix.  Monitor carefully.  If renal function is worsening, discontinue Lasix and consult nephrology.  Avoid other nephrotoxins.  4.  Elevated troponin-could be from elevated heart rate, denies any chest pain and no EKG changes. -Patient anyways on heparin drip.  Cardiology has been consulted. -Monitor on telemetry and recycle troponins.  5.  Leukocytosis-could be from recent use of steroids -However lactic acid is also elevated.  No fevers noted. -We will order blood cultures and urine analysis.  Will hold off on antibiotics unless any of these are positive.  6.  Back pain-acute back pain started in the last 2 weeks.  Initially in the mid back and now more lower  back.  Ordered lumbar and thoracic spine x-rays. -Lumbar spine x-ray with significant degenerative disc disease but thoracic spine x-ray with some T6-T7 vertebral body erosions. -We will get MRI for further evaluation due to history of lymphoma and history of breast cancer.  7.  DVT prophylaxis-currently on heparin drip  Physical therapy consult when medically stable.  Patient is independent at baseline.    All the records are reviewed and case discussed with ED  provider. Management plans discussed with the patient, family and they are in agreement.  CODE STATUS: Full Code  TOTAL TIME TAKING CARE OF THIS PATIENT: 52 minutes.    Gladstone Lighter M.D on 09/27/2018 at 6:31 PM  Between 7am to 6pm - Pager - [phone]  After 6pm go to [email or web address] - password [email or web address] [facility]  Office  [phone]  CC: Primary care physician; Ezequiel Kayser, MD

## 2018-09-27 NOTE — ED Notes (Signed)
Patient transported to CT 

## 2018-09-27 NOTE — ED Provider Notes (Signed)
[facility] Emergency Department Provider Note  Time seen: 1:29 PM  I have reviewed the triage vital signs and the nursing notes.   HISTORY  Chief Complaint Fall    HPI Sandy Hickman is a 84 y.o. female with a past medical history of arthritis, gastric reflux, CKD, hypertension, hyperlipidemia, presents to the emergency department for generalized weakness and a fall.  According to the patient she slipped at home and tripped over something on the floor.  States fell to the ground but denies hitting her head.  Denies LOC.  States she slid down to the ground.  Daughter is here with the patient states for the past 2 weeks the patient has been feeling very weak, they have seen her doctor twice over the past 2 weeks for the same and have been told that everything looks normal.  Patient does have a history of atrial fibrillation, states she takes Eliquis and metoprolol for this.  Found to be in rapid ventricular rate upon arrival around 160 bpm.  Daughter states her primary care doctor placed her on a prednisone course for the generalized weakness but they are not sure why.  Denies any focal weakness denies any headache.  Largely negative review of systems.   Past Medical History:  Diagnosis Date  . Allergy   . Arrhythmia   . Arthritis   . Breast cancer ([place]) 2006   rt mastectomy  . Chronic kidney disease    chronic uti, kidney stones  . GERD (gastroesophageal reflux disease)   . Glaucoma   . HLD (hyperlipidemia)   . Hypertension   . 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 Personal history of chemotherapy   . Personal history of radiation therapy   . 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 ABDOMINAL HYSTERECTOMY    . BLADDER SUSPENSION     15 years ago  .  BREAST SURGERY    . cataract surgery Bilateral   . CHOLECYSTECTOMY    . JOINT REPLACEMENT    . KNEE SURGERY Right    2016  . MASTECTOMY Right 2006   rt mast /rad/chemo  . ROTATOR CUFF REPAIR     13 years    Prior to Admission medications   Medication Sig Start Date End Date Taking? Authorizing Provider  apixaban (ELIQUIS) 5 MG TABS tablet Take 5 mg by mouth 2 (two) times daily.    [provider]  ascorbic acid (VITAMIN C) 500 MG tablet Take 500 mg by mouth daily.    [provider]  Cranberry 500 MG CAPS Take by mouth.     [provider]  cyanocobalamin 500 MCG tablet Take 500 mcg by mouth daily.     [provider]  ezetimibe (ZETIA) 10 MG tablet Take 10 mg by mouth daily.  03/10/16 03/08/18  [provider]  ezetimibe (ZETIA) 10 MG tablet  04/17/18   [provider]  ferrous sulfate 325 (65 FE) MG tablet Take 325 mg by mouth daily with breakfast.     [provider]  gabapentin (NEURONTIN) 300 MG capsule Take 300 mg by mouth at bedtime. 1-2 capsules nightly as directed 09/23/16   [provider]  gemfibrozil (LOPID) 600 MG tablet Take 600 mg by mouth daily.  [provider]  Lactobacillus (PROBIOTIC ACIDOPHILUS PO) Take 1 Dose by mouth daily.     [provider]  latanoprost (XALATAN) 0.005 % ophthalmic solution latanoprost 0.005 % eye drops  USE 1 DROP(S) IN BOTH EYES ONCE IN THE EVENINGS    [provider]  loratadine (CLARITIN) 10 MG tablet Take 10 mg by mouth daily as needed.     [provider]  LORazepam (ATIVAN) 1 MG tablet TAKE 1 TABLET (1 MG TOTAL) BY MOUTH ONCE FOR 1 DOSE. TAKE 1 TABLET 30 MINUTES PRIOR TO PROCEDURE. 05/03/18   [provider]  losartan (COZAAR) 100 MG tablet Take 100 mg by mouth daily.    [provider]  Melatonin 10 MG TABS Take by mouth.     [provider]  methenamine (HIPREX) 1 g tablet Take 1 tablet (1 g total) by  mouth 2 (two) times daily with a meal. 10/23/17   McGowan, Larene Beach A, PA-C  metoprolol succinate (TOPROL-XL) 25 MG 24 hr tablet Take 25 mg by mouth daily.    [provider]  mirtazapine (REMERON) 15 MG tablet Take 15 mg by mouth at bedtime.  09/09/16   [provider]  Multiple Vitamins-Minerals (CENTRUM SILVER PO) Take 1 each by mouth daily.    [provider]  Omega-3 Fatty Acids (FISH OIL PO) Take 500 mg by mouth daily.     [provider]  omeprazole (PRILOSEC) 20 MG capsule Take 20 mg by mouth daily.    [provider]  ondansetron (ZOFRAN) 4 MG tablet Take 1 tablet (4 mg total) by mouth every 6 (six) hours. Patient not taking: Reported on 05/07/2018 10/30/16   Lysbeth Penner, FNP  Timolol Maleate PF 0.25 % SOLN Apply 1 drop to eye daily.    [provider]  trimethoprim (TRIMPEX) 100 MG tablet Take 1 tablet (100 mg total) by mouth daily. 06/11/18   Zara Council A, PA-C    Allergies  Allergen Reactions  . Neosporin [Neomycin-Bacitracin Zn-Polymyx] Dermatitis  . Sulfa Antibiotics Itching and Rash  . Enalapril Other (See Comments)  . Oxycodone-Acetaminophen Itching    Family History  Problem Relation Age of Onset  . Breast cancer Daughter 65  . Hypertension Father   . Stroke Father   . Diabetes Sister   . Breast cancer Cousin        pat cousin  . Kidney cancer Son   . Prostate cancer Neg Hx   . Bladder Cancer Neg Hx     Social History Social History   Tobacco Use  . Smoking status: Never Smoker  . Smokeless tobacco: Never Used  Substance Use Topics  . Alcohol use: Yes    Comment: Occasional  . Drug use: No    Review of Systems Constitutional: Negative for fever.  Positive for generalized weakness/fatigue Cardiovascular: Negative for chest pain. Respiratory: Negative for shortness of breath. Gastrointestinal: Negative for abdominal pain, vomiting and diarrhea. Genitourinary: Negative for urinary  compaints Musculoskeletal: Negative for musculoskeletal complaints Skin: Negative for skin complaints  Neurological: Negative for headache All other ROS negative  ____________________________________________   PHYSICAL EXAM:  VITAL SIGNS: ED Triage Vitals  Enc Vitals Group     BP 09/27/18 1323 97/67     Pulse Rate 09/27/18 1323 (!) 146     Resp 09/27/18 1323 (!) 25     Temp 09/27/18 1323 (!) 97.4 F (36.3 C)     Temp Source 09/27/18 1323 Oral     SpO2  09/27/18 1323 94 %     Weight 09/27/18 1313 150 lb (68 kg)     Height 09/27/18 1313 5\' 5"  (1.651 m)     Head Circumference --      Peak Flow --      Pain Score 09/27/18 1313 8     Pain Loc --      Pain Edu? --      Excl. in [place]? --    Constitutional: Alert and oriented.  Patient somewhat pale in appearance and mildly diaphoretic. Eyes: Normal exam ENT   Head: Normocephalic and atraumatic.   Mouth/Throat: Mucous membranes are moist. Cardiovascular: Irregular rhythm, rate around 150. Respiratory: Normal respiratory effort without tachypnea nor retractions. Breath sounds are clear  Gastrointestinal: Soft and nontender. No distention.   Musculoskeletal: Nontender with normal range of motion in all extremities. No lower extremity tenderness or edema. Neurologic:  Normal speech and language. No gross focal neurologic deficits Skin:  Skin is warm, mild diaphoresis. Psychiatric: Mood and affect are normal. Speech and behavior are normal.   ____________________________________________    EKG  EKG reviewed and interpreted by myself shows atrial fibrillation with rapid ventricular response at 145 bpm with a narrow QRS, left axis deviation, largely normal intervals with nonspecific ST changes slight ST depression in the lateral leads, possibly due to demand ischemia.  No significant ST elevation noted.  ____________________________________________    RADIOLOGY  Chest x-ray shows possible interstitial  edema.  ____________________________________________   INITIAL IMPRESSION / ASSESSMENT AND PLAN / ED COURSE  Pertinent labs & imaging results that were available during my care of the patient were reviewed by me and considered in my medical decision making (see chart for details).  Patient presents to the emergency department after a fall at home.  Denies hitting her head however the patient is on Eliquis will obtain CT imaging as a precaution.  Patient has a history of atrial fibrillation but is currently in A. fib with rapid ventricular response around 150, we will dose IV diltiazem, IV fluids and continue to closely monitor.  Daughter states generalized fatigue and weakness over the past 2 weeks, we will check labs including cardiac enzymes and a urinalysis.  We will continue to closely monitor.  I anticipate likely admission to the hospital given her initial presentation.  Chest x-ray shows possible interstitial edema.  Patient's labs are resulted showing a lactic acidosis of 3.1, white blood cell count of 23,000 however this could be explained as the patient was recently put on steroids.  Patient does have a mild increase in troponin 0.32 however as she is in A. fib with RVR initially at 160 bpm this could very likely be related to demand ischemia.  Patient received IV diltiazem with minimal response to the heart rate currently around 1 20-1 30.  We will start on IV diltiazem infusion.  Patient's creatinine has doubled consistent with renal insufficiency.  Ideally we would like to get a CT angiography of the chest to further evaluate however given the patient's decreased GFR I discussed with radiologist and we are not able to do so at this time.  Given the patient's shortness of breath and back discomfort she does not believe she be able to lie down for prolonged time for VQ scan at this time.  Hopefully with heart rate control and treatment within the hospital we can get return of her kidney  function, and proceed possible CT scan at that point.  At this time I believe the  patient is stable for admission to the hospital service for continued work-up and management.  CRITICAL CARE Performed by: Harvest Dark   Total critical care time: 30 minutes  Critical care time was exclusive of separately billable procedures and treating other patients.  Critical care was necessary to treat or prevent imminent or life-threatening deterioration.  Critical care was time spent personally by me on the following activities: development of treatment plan with patient and/or surrogate as well as nursing, discussions with consultants, evaluation of patient's response to treatment, examination of patient, obtaining history from patient or surrogate, ordering and performing treatments and interventions, ordering and review of laboratory studies, ordering and review of radiographic studies, pulse oximetry and re-evaluation of patient's condition.   ____________________________________________   FINAL CLINICAL IMPRESSION(S) / ED DIAGNOSES  Atrial fibrillation with rapid ventricular response Generalized fatigue and weakness Fall Acute renal insufficiency Elevated troponin   Harvest Dark, MD 09/27/18 1515

## 2018-09-27 NOTE — ED Triage Notes (Addendum)
Pt comes via [facility] from home with c/o fall. Pt was going to sit up and tripped over something in the floor. Pt denies LOC or hitting head. Pt states she kinda just slide down. Pt has also been feeling weak recently and not eating. EMS reports vitals, BP-106/64, HR-150, BS-302, O2-90% 2L. Pt is A&OX4.  Pt pale and diaphoretic. Pt denies any chest pain. Pt has hx of AFIB, HR-162

## 2018-09-27 NOTE — ED Notes (Signed)
Date and time results received: 09/27/18 1413 (use smartphrase ".now" to insert current time)  Test: troponin Critical Value: 0.32  Name of Provider Notified: Paduchowski  Orders Received? Or Actions Taken?: Orders Received - See Orders for details

## 2018-09-27 NOTE — ED Notes (Signed)
Pt taken to xray 

## 2018-09-27 NOTE — ED Notes (Signed)
MD Paduchoswki informed of critical lab result of lactic 3.1

## 2018-09-27 NOTE — Progress Notes (Signed)
   [place] at [facility] Day: 0 days Sandy Hickman is a 84 y.o. female presenting with Fall .   Advance care planning discussed with patient  And her daughter at bedside. All questions in regards to overall condition and expected prognosis answered.  Patient usually is very independent and able to care for herself.  She wants to fight over this acute illness.  She is okay with resuscitation including mechanical ventilation, chest compressions and shocking, need for vasoactive drugs etc. The decision was made to  continue current code status  CODE STATUS: Full Code Time spent: 18 minutes

## 2018-09-27 NOTE — ED Notes (Signed)
Pt back from x-ray.

## 2018-09-27 NOTE — ED Notes (Signed)
Pt informed of needing urine when able to

## 2018-09-27 NOTE — Consult Note (Signed)
[facility] Consultation Note  Patient ID: Sandy Hickman, MRN: [id number], DOB/AGE: 03/22/1934 84 y.o. Admit date: 09/27/2018   Date of Consult: 09/27/2018 Primary Physician: Ezequiel Kayser, MD Primary Cardiologist: Nehemiah Massed  Chief Complaint:  Chief Complaint  Patient presents with  . Fall   Reason for Consult: Heart failure  HPI: 84 y.o. female with paroxysmal nonvalvular atrial fibrillation previously on appropriate medication management who has had significant progression of weakness fatigue and diffuse abdominal and chest discomfort throughout the last several weeks.  She is unable to get appropriate nutrition or walk around.  She is having back pain as well.  There have been multiple assessments including an echocardiogram for valvular heart disease with normal LV function.  Chest x-ray and CAT scan show no evidence of significant pneumonia but some minimal pulmonary edema.  The patient does have a BNP of 697 but no evidence of myocardial infarction with atrial fibrillation with rapid ventricular rate.  Patient has had no evidence of coronary artery disease and no calcifications by CAT scan in her coronary arteries.  And unable to do any physical activity  Past Medical History:  Diagnosis Date  . Allergy   . Arrhythmia   . Arthritis   . Atrial fibrillation ([place])   . Breast cancer ([place]) 2006   rt mastectomy  . Chronic kidney disease    chronic uti, kidney stones  . GERD (gastroesophageal reflux disease)   . Glaucoma   . HLD (hyperlipidemia)   . Hypertension   . 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 Personal history of chemotherapy   . Personal history of radiation therapy   . 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 ABDOMINAL HYSTERECTOMY    . BLADDER SUSPENSION     15 years ago  . BREAST SURGERY    . cataract surgery Bilateral   . CHOLECYSTECTOMY    .  JOINT REPLACEMENT    . KNEE SURGERY Right    2016  . MASTECTOMY Right 2006   rt mast /rad/chemo  . ROTATOR CUFF REPAIR     13 years     Home Meds: Prior to Admission medications   Medication Sig Start Date End Date Taking? Authorizing Provider  acidophilus (RISAQUAD) CAPS capsule Take 1 capsule by mouth daily.   Yes [provider]  apixaban (ELIQUIS) 5 MG TABS tablet Take 5 mg by mouth 2 (two) times daily.   Yes [provider]  ascorbic acid (VITAMIN C) 500 MG tablet Take 500 mg by mouth daily.   Yes [provider]  Cranberry 500 MG CAPS Take 500 mg by mouth daily.    Yes [provider]  cyanocobalamin 500 MCG tablet Take 500 mcg by mouth daily.    Yes [provider]  ezetimibe (ZETIA) 10 MG tablet Take 10 mg by mouth daily.    Yes [provider]  ferrous sulfate 325 (65 FE) MG tablet Take 325 mg by mouth daily with breakfast.    Yes [provider]  gabapentin (NEURONTIN) 300 MG capsule Take 300 mg by mouth at bedtime.    Yes [provider]  gemfibrozil (LOPID) 600 MG tablet Take 600 mg by mouth daily.   Yes [provider]  latanoprost (XALATAN) 0.005 % ophthalmic solution Place 1 drop into both eyes at bedtime.    Yes [provider]  loratadine (CLARITIN) 10 MG tablet Take 10 mg by mouth daily as needed for allergies.    Yes [provider]  losartan (COZAAR) 100 MG tablet Take 100 mg by mouth daily.   Yes [provider]  Melatonin 10 MG TABS Take 10 mg by mouth at bedtime.    Yes [provider]  metoprolol succinate (TOPROL-XL) 25 MG 24 hr tablet Take 25 mg by mouth daily.   Yes [provider]  mirtazapine (REMERON) 15 MG tablet Take 15 mg by mouth at bedtime.  09/09/16  Yes [provider]  Multiple Vitamins-Minerals (CENTRUM SILVER PO) Take 1 tablet by mouth daily.    Yes [provider]  Omega-3 Fatty Acids (FISH OIL) 500 MG CAPS  Take 500 mg by mouth daily.   Yes [provider]  omeprazole (PRILOSEC) 20 MG capsule Take 20 mg by mouth daily.   Yes [provider]  predniSONE (STERAPRED UNI-PAK 21 TAB) 10 MG (21) TBPK tablet Take 10-60 mg by mouth See admin instructions. 60 mg daily on day 1, the 50 mg daily on day 2, then 40 mg daily on day 3, then 30 mg daily on day 4, then 20 mg daily on day 5, then 10 mg daily on day 6, then stop 09/22/18  Yes [provider]  timolol (TIMOPTIC) 0.5 % ophthalmic solution Place 1 drop into both eyes daily.   Yes [provider]  traMADol (ULTRAM) 50 MG tablet Take 50 mg by mouth every 6 (six) hours as needed for moderate pain.    Yes [provider]    Inpatient Medications:  . acidophilus  1 capsule Oral Daily  . ezetimibe  10 mg Oral Daily  . [START ON 09/28/2018] ferrous sulfate  325 mg Oral Q breakfast  . furosemide  20 mg Intravenous Q12H  . gabapentin  300 mg Oral QHS  . gemfibrozil  600 mg Oral Daily  . latanoprost  1 drop Both Eyes QHS  . Melatonin   Oral QHS  . metoprolol succinate  25 mg Oral Daily  . mirtazapine  15 mg Oral QHS  . multivitamin with minerals  1 tablet Oral Daily  . omega-3 acid ethyl esters  1 g Oral Daily  . pantoprazole  40 mg Oral Daily  . timolol  1 drop Both Eyes Daily  . vitamin B-12  500 mcg Oral Daily  . ascorbic acid  500 mg Oral Daily   . diltiazem (CARDIZEM) infusion    . heparin      Allergies:  Allergies  Allergen Reactions  . Neosporin [Neomycin-Bacitracin Zn-Polymyx] Dermatitis  . Sulfa Antibiotics Itching and Rash  . Oxycodone-Acetaminophen Itching    Social History   Socioeconomic History  . Marital status: Married    Spouse name: Not on file  . Number of children: Not on file  . Years of education: Not on file  . Highest education level: Not on file  Occupational History  . Not on file  Social Needs  . Financial resource strain: Not on file  . Food insecurity:    Worry:  Not on file    Inability: Not on file  . Transportation needs:    Medical: Not on file    Non-medical: Not on file  Tobacco Use  . Smoking status: Never Smoker  . Smokeless tobacco: Never Used  Substance and Sexual Activity  . Alcohol use: Yes    Comment: Occasional  . Drug use: No  .  Sexual activity: Not on file  Lifestyle  . Physical activity:    Days per week: Not on file    Minutes per session: Not on file  . Stress: Not on file  Relationships  . Social connections:    Talks on phone: Not on file    Gets together: Not on file    Attends religious service: Not on file    Active member of club or organization: Not on file    Attends meetings of clubs or organizations: Not on file    Relationship status: Not on file  . Intimate partner violence:    Fear of current or ex partner: Not on file    Emotionally abused: Not on file    Physically abused: Not on file    Forced sexual activity: Not on file  Other Topics Concern  . Not on file  Social History Narrative   Lives at home by herself.  Independent at baseline     Family History  Problem Relation Age of Onset  . Breast cancer Daughter 39  . Hypertension Father   . Stroke Father   . Diabetes Sister   . Breast cancer Cousin        pat cousin  . Kidney cancer Son   . Prostate cancer Neg Hx   . Bladder Cancer Neg Hx      Review of Systems Positive for weak fatigue shortness of breath Negative for: General:  chills, fever, night sweats or weight changes.  Cardiovascular: PND orthopnea syncope dizziness  Dermatological skin lesions rashes Respiratory: Cough congestion Urologic: Frequent urination urination at night and hematuria Abdominal: negative for nausea, vomiting, diarrhea, bright red blood per rectum, melena, or hematemesis Neurologic: negative for visual changes, and/or hearing changes  All other systems reviewed and are otherwise negative except as noted above.  Labs: Recent Labs    09/27/18 1337   TROPONINI 0.32*   Lab Results  Component Value Date   WBC 23.0 (H) 09/27/2018   HGB 13.9 09/27/2018   HCT 44.9 09/27/2018   MCV 96.8 09/27/2018   PLT 177 09/27/2018    Recent Labs  Lab 09/27/18 1337  NA 138  K 4.6  CL 104  CO2 18*  BUN 47*  CREATININE 1.65*  CALCIUM 12.9*  PROT 6.8  BILITOT 1.3*  ALKPHOS 256*  ALT 43  AST 47*  GLUCOSE 288*   No results found for: CHOL, HDL, LDLCALC, TRIG No results found for: DDIMER  Radiology/Studies:  Dg Thoracic Spine 2 View  Result Date: 09/27/2018 CLINICAL DATA:  Weakness, back pain EXAM: THORACIC SPINE 2 VIEWS COMPARISON:  CT 05/04/2018 FINDINGS: Thoracic alignment is within normal limits. The vertebral body heights are maintained. Questionable erosions on the left side of the T6 and T7 vertebral bodies. IMPRESSION: 1. Questionable erosive change along the left aspect of the T6 and T7 vertebral bodies. CT could be obtained for further evaluation. Electronically Signed   By: Donavan Foil M.D.   On: 09/27/2018 16:53   Dg Lumbar Spine 2-3 Views  Result Date: 09/27/2018 CLINICAL DATA:  Weakness with back pain EXAM: LUMBAR SPINE - 2-3 VIEW COMPARISON:  CT 08/10/2015 FINDINGS: Trace retrolisthesis L2 on L3 and L3 on L4. Moderate severe degenerative changes L3-L4, L1-L2 and L2-L3. Levoscoliosis of the lumbar spine. IMPRESSION: Scoliosis and marked multiple level degenerative change without acute osseous abnormality. Electronically Signed   By: Donavan Foil M.D.   On: 09/27/2018 17:12   Ct Head Wo Contrast  Result Date:  09/27/2018 CLINICAL DATA:  84 y/o  F; fall on Eliquis. Generalized weakness. EXAM: CT HEAD WITHOUT CONTRAST TECHNIQUE: Contiguous axial images were obtained from the base of the skull through the vertex without intravenous contrast. COMPARISON:  05/04/2018 MRI of the head. FINDINGS: Brain: No evidence of acute infarction, hemorrhage, hydrocephalus, extra-axial collection or mass lesion/mass effect of the brain parenchyma.  Stable right lateral frontal meningioma measuring 16 mm (series 4, image 22). Few stable nonspecific white matter hypodensities compatible with mild chronic microvascular ischemic changes. Stable mild volume loss of the brain. Vascular: Mild calcific atherosclerosis of the carotid siphons. Skull: Normal. Negative for fracture or focal lesion. Sinuses/Orbits: No acute finding. Other: Borderline high-riding jugular bulb. IMPRESSION: 1. No acute intracranial abnormality. 2. Stable mild chronic microvascular ischemic changes and mild volume loss of the brain. 3. Stable right lateral frontal region meningioma. Electronically Signed   By: Kristine Garbe M.D.   On: 09/27/2018 13:57   Dg Chest Portable 1 View  Result Date: 09/27/2018 CLINICAL DATA:  Weakness and fall.  Tachycardia. EXAM: PORTABLE CHEST 1 VIEW COMPARISON:  07/02/2013 radiographs. 09/15/2018 chest radiograph report, but images are not available to view. FINDINGS: The cardiomediastinal silhouette is unremarkable. Interstitial prominence bilaterally noted. Elevation of the RIGHT hemidiaphragm and surgical clips in the RIGHT axillary region again identified. No pleural effusion, pneumothorax or definite airspace disease. No acute bony abnormalities are identified. IMPRESSION: Mild interstitial prominence of uncertain chronicity. Infection or mild edema are considerations if acute. No other significant abnormalities. Electronically Signed   By: Margarette Canada M.D.   On: 09/27/2018 14:01    EKG: Atrial fibrillation with rapid ventricular rate  Weights: Filed Weights   09/27/18 1313  Weight: 68 kg     Physical Exam: Blood pressure (!) 98/57, pulse (!) 109, temperature 97.7 F (36.5 C), temperature source Oral, resp. rate (!) 25, height 5\' 5"  (1.651 m), weight 68 kg, SpO2 94 %. Body mass index is 24.96 kg/m. General: Well developed, well nourished, in no acute distress. Head eyes ears nose throat: Normocephalic, atraumatic, sclera  non-icteric, no xanthomas, nares are without discharge. No apparent thyromegaly and/or mass  Lungs: Normal respiratory effort.  You wheezes, no rales, no rhonchi.  Heart: irregular with normal S1 S2. no murmur gallop, no rub, PMI is normal size and placement, carotid upstroke normal without bruit, jugular venous pressure is normal Abdomen: Soft, non-tender, non-distended with normoactive bowel sounds. No hepatomegaly. No rebound/guarding. No obvious abdominal masses. Abdominal aorta is normal size without bruit Extremities: Trace edema. no cyanosis, no clubbing, no ulcers  Peripheral : 2+ bilateral upper extremity pulses, 2+ bilateral femoral pulses, 2+ bilateral dorsal pedal pulse Neuro: Alert and oriented. No facial asymmetry. No focal deficit. Moves all extremities spontaneously. Musculoskeletal: Normal muscle tone without kyphosis Psych:  Responds to questions appropriately with a normal affect.    Assessment: 84 year old female with paroxysmal nonvalvular atrial fibrillation with rapid ventricular rate likely exacerbated by fall weakness fatigue and causing of pulmonary edema and acute diastolic dysfunction heart failure  Plan: 1.  Gentle diuresis for acute diastolic dysfunction heart failure due to atrial fibrillation with rapid ventricular rate 2.  Heart rate control with diltiazem 3.  Anticoagulation for further risk reduction in stroke with atrial fibrillation 4.  Further evaluation of other causes of significant symptoms including discontinuation of multiple medications which may be causing her issues including discontinuation of Zetia possibly gemfibrozil gabapentin and/or tramadol. 5.  Begin ambulation and further evaluation and treatment options  Signed, Everlean Cherry  Nehemiah Massed M.D. [facility] 09/27/2018, 6:56 PM

## 2018-09-27 NOTE — ED Notes (Signed)
MD at bedside. 

## 2018-09-27 NOTE — ED Notes (Signed)
Pt assisted with bedpan. Pt unable to use bedpan at this time. Recollect lactic sent to lab. Pt requested pull up to be put on. Pt resting comfortably at this time. Family at bedside

## 2018-09-27 NOTE — Consult Note (Signed)
ANTICOAGULATION CONSULT NOTE - Initial Consult  Pharmacy Consult for Heparin Indication: atrial fibrillation  Allergies  Allergen Reactions  . Neosporin [Neomycin-Bacitracin Zn-Polymyx] Dermatitis  . Sulfa Antibiotics Itching and Rash  . Enalapril Other (See Comments)  . Oxycodone-Acetaminophen Itching    Patient Measurements: Height: 5\' 5"  (165.1 cm) Weight: 150 lb (68 kg) IBW/kg (Calculated) : 57 Heparin Dosing Weight: 68   Vital Signs: Temp: 97.4 F (36.3 C) (11/04 1323) Temp Source: Oral (11/04 1323) BP: 116/63 (11/04 1530) Pulse Rate: 108 (11/04 1530)  Labs: Recent Labs    09/27/18 1337  HGB 13.9  HCT 44.9  PLT 177  CREATININE 1.65*  TROPONINI 0.32*    Estimated Creatinine Clearance: 23.7 mL/min (A) (by C-G formula based on SCr of 1.65 mg/dL (H)).   Medical History: Past Medical History:  Diagnosis Date  . Allergy   . Arrhythmia   . Arthritis   . Atrial fibrillation ([place])   . Breast cancer ([place]) 2006   rt mastectomy  . Chronic kidney disease    chronic uti, kidney stones  . GERD (gastroesophageal reflux disease)   . Glaucoma   . HLD (hyperlipidemia)   . Hypertension   . 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 Personal history of chemotherapy   . Personal history of radiation therapy   . 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    Choosing to not bolus per protocol and conversation with admitting MD       Goal of Therapy:  Heparin level 0.3-0.7 units/ml Monitor platelets by anticoagulation protocol: Yes   Plan:  Ordered baseline labs baseline apTT, INR, and heparin level  Starting heparin infusion 1000 units/hr at 11/4 2200 (previous dose of apixiban at 11/4 1000)   Re-checking heparin level at 8 hours after start of infusion, CBC am  Lu Duffel, PharmD Clinical Pharmacist 09/27/2018 4:56 PM

## 2018-09-28 ENCOUNTER — Inpatient Hospital Stay: Payer: Medicare HMO

## 2018-09-28 ENCOUNTER — Encounter: Payer: Self-pay | Admitting: Radiology

## 2018-09-28 DIAGNOSIS — D72829 Elevated white blood cell count, unspecified: Secondary | ICD-10-CM

## 2018-09-28 DIAGNOSIS — C8309 Small cell B-cell lymphoma, extranodal and solid organ sites: Secondary | ICD-10-CM

## 2018-09-28 DIAGNOSIS — I4891 Unspecified atrial fibrillation: Principal | ICD-10-CM

## 2018-09-28 DIAGNOSIS — N2889 Other specified disorders of kidney and ureter: Secondary | ICD-10-CM

## 2018-09-28 DIAGNOSIS — R160 Hepatomegaly, not elsewhere classified: Secondary | ICD-10-CM

## 2018-09-28 LAB — CBC
HCT: 36.4 % (ref 36.0–46.0)
HEMOGLOBIN: 11.7 g/dL — AB (ref 12.0–15.0)
MCH: 29.9 pg (ref 26.0–34.0)
MCHC: 32.1 g/dL (ref 30.0–36.0)
MCV: 93.1 fL (ref 80.0–100.0)
PLATELETS: 149 10*3/uL — AB (ref 150–400)
RBC: 3.91 MIL/uL (ref 3.87–5.11)
RDW: 13.2 % (ref 11.5–15.5)
WBC: 16.2 10*3/uL — AB (ref 4.0–10.5)
nRBC: 0.1 % (ref 0.0–0.2)

## 2018-09-28 LAB — ECHOCARDIOGRAM COMPLETE
Height: 65 in
Weight: 2400 oz

## 2018-09-28 LAB — BASIC METABOLIC PANEL
ANION GAP: 7 (ref 5–15)
BUN: 44 mg/dL — ABNORMAL HIGH (ref 8–23)
CO2: 24 mmol/L (ref 22–32)
Calcium: 11.8 mg/dL — ABNORMAL HIGH (ref 8.9–10.3)
Chloride: 109 mmol/L (ref 98–111)
Creatinine, Ser: 1.23 mg/dL — ABNORMAL HIGH (ref 0.44–1.00)
GFR calc Af Amer: 46 mL/min — ABNORMAL LOW (ref >60)
GFR calc non Af Amer: 40 mL/min — ABNORMAL LOW (ref >60)
GLUCOSE: 141 mg/dL — AB (ref 70–99)
POTASSIUM: 4.2 mmol/L (ref 3.5–5.1)
Sodium: 140 mmol/L (ref 135–145)

## 2018-09-28 LAB — URINALYSIS, COMPLETE (UACMP) WITH MICROSCOPIC
Bacteria, UA: NONE SEEN
Bilirubin Urine: NEGATIVE
Glucose, UA: NEGATIVE mg/dL
Hgb urine dipstick: NEGATIVE
Ketones, ur: NEGATIVE mg/dL
Leukocytes, UA: NEGATIVE
Nitrite: NEGATIVE
PH: 5 (ref 5.0–8.0)
Protein, ur: NEGATIVE mg/dL
SPECIFIC GRAVITY, URINE: 1.01 (ref 1.005–1.030)

## 2018-09-28 LAB — TROPONIN I
TROPONIN I: 0.81 ng/mL — AB (ref <0.03)
TROPONIN I: 0.63 ng/mL — AB (ref <0.03)

## 2018-09-28 LAB — APTT
APTT: 77 s — AB (ref 24–36)
aPTT: 64 seconds — ABNORMAL HIGH (ref 24–36)

## 2018-09-28 LAB — HEPARIN LEVEL (UNFRACTIONATED): HEPARIN UNFRACTIONATED: 2 [IU]/mL — AB (ref 0.30–0.70)

## 2018-09-28 LAB — LACTIC ACID, PLASMA: LACTIC ACID, VENOUS: 1.6 mmol/L (ref 0.5–1.9)

## 2018-09-28 MED ORDER — ENSURE ENLIVE PO LIQD
237.0000 mL | Freq: Three times a day (TID) | ORAL | Status: DC
  Administered 2018-09-28 – 2018-09-29 (×2): 237 mL via ORAL

## 2018-09-28 MED ORDER — ZOLEDRONIC ACID 4 MG/5ML IV CONC
4.0000 mg | Freq: Once | INTRAVENOUS | Status: AC
  Administered 2018-09-28: 4 mg via INTRAVENOUS
  Filled 2018-09-28: qty 5

## 2018-09-28 MED ORDER — SODIUM CHLORIDE 0.9 % IV SOLN
INTRAVENOUS | Status: AC
  Administered 2018-09-28 – 2018-09-29 (×2): via INTRAVENOUS

## 2018-09-28 MED ORDER — HEPARIN BOLUS VIA INFUSION
1000.0000 [IU] | Freq: Once | INTRAVENOUS | Status: AC
  Administered 2018-09-28: 1000 [IU] via INTRAVENOUS
  Filled 2018-09-28: qty 1000

## 2018-09-28 MED ORDER — IOHEXOL 300 MG/ML  SOLN
75.0000 mL | Freq: Once | INTRAMUSCULAR | Status: AC | PRN
  Administered 2018-09-28: 75 mL via INTRAVENOUS

## 2018-09-28 MED ORDER — IOPAMIDOL (ISOVUE-300) INJECTION 61%
15.0000 mL | INTRAVENOUS | Status: AC
  Administered 2018-09-28 (×2): 15 mL via ORAL

## 2018-09-28 NOTE — Progress Notes (Signed)
Pt currently on cardizem gtt at 5mg , Afib on tele HR in the 80's, BP 94/49, RR 18, SPO2 91% on R/A. Dr Verdell Carmine notified, Per MD Okay to stop cardizem drip. Pt placed on 2L via N/C for comfort. Will continue to monitor and assess.

## 2018-09-28 NOTE — Consult Note (Signed)
ANTICOAGULATION CONSULT NOTE - Initial Consult  Pharmacy Consult for Heparin Indication: atrial fibrillation  Allergies  Allergen Reactions  . Neosporin [Neomycin-Bacitracin Zn-Polymyx] Dermatitis  . Sulfa Antibiotics Itching and Rash  . Oxycodone-Acetaminophen Itching    Patient Measurements: Height: 5\' 5"  (165.1 cm) Weight: 146 lb 11.2 oz (66.5 kg) IBW/kg (Calculated) : 57 Heparin Dosing Weight: 68   Vital Signs: Temp: 97.7 F (36.5 C) (11/05 0420) Temp Source: Oral (11/05 0420) BP: 108/62 (11/05 0546) Pulse Rate: 81 (11/05 0546)  Labs: Recent Labs    09/27/18 1337 09/27/18 1622 09/27/18 2129 09/28/18 0356 09/28/18 0605  HGB 13.9  --   --  11.7*  --   HCT 44.9  --   --  36.4  --   PLT 177  --   --  149*  --   APTT  --  <24*  --   --  77*  LABPROT  --  15.2  --   --   --   INR  --  1.21  --   --   --   HEPARINUNFRC  --  1.74*  --   --   --   CREATININE 1.65*  --   --  1.23*  --   TROPONINI 0.32*  --  0.82* 0.81*  --     Estimated Creatinine Clearance: 31.7 mL/min (A) (by C-G formula based on SCr of 1.23 mg/dL (H)).   Medical History: Past Medical History:  Diagnosis Date  . Allergy   . Arrhythmia   . Arthritis   . Atrial fibrillation ([place])   . Breast cancer ([place]) 2006   rt mastectomy  . Chronic kidney disease    chronic uti, kidney stones  . GERD (gastroesophageal reflux disease)   . Glaucoma   . HLD (hyperlipidemia)   . Hypertension   . 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 Personal history of chemotherapy   . Personal history of radiation therapy   . 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  (last dose 11/4 @ 1000) HL elevated at baseline  Choosing to not bolus per protocol and conversation with admitting MD       Goal of Therapy:  Heparin level 0.3-0.7 units/ml Monitor platelets by anticoagulation protocol: Yes   Plan:  APTT is  therapeutic at 77. Continue heparin at the same rate 1000 units/hr and check confirmation APTT in 8 hours. Continue to dose off of APTT until HL and APTT corollate. Check HL just daily.  Ramond Dial, PharmD Clinical Pharmacist 09/28/2018 8:15 AM

## 2018-09-28 NOTE — Consult Note (Signed)
ANTICOAGULATION CONSULT NOTE - Initial Consult  Pharmacy Consult for Heparin Indication: atrial fibrillation  Allergies  Allergen Reactions  . Neosporin [Neomycin-Bacitracin Zn-Polymyx] Dermatitis  . Sulfa Antibiotics Itching and Rash  . Oxycodone-Acetaminophen Itching    Patient Measurements: Height: 5\' 5"  (165.1 cm) Weight: 146 lb 11.2 oz (66.5 kg) IBW/kg (Calculated) : 57 Heparin Dosing Weight: 68   Vital Signs: Temp: 97.7 F (36.5 C) (11/05 1409) Temp Source: Oral (11/05 1409) BP: 96/64 (11/05 1409) Pulse Rate: 87 (11/05 1409)  Labs: Recent Labs    09/27/18 1337 09/27/18 1622 09/27/18 2129 09/28/18 0356 09/28/18 0605 09/28/18 0952 09/28/18 1433  HGB 13.9  --   --  11.7*  --   --   --   HCT 44.9  --   --  36.4  --   --   --   PLT 177  --   --  149*  --   --   --   APTT  --  <24*  --   --  77*  --  64*  LABPROT  --  15.2  --   --   --   --   --   INR  --  1.21  --   --   --   --   --   HEPARINUNFRC  --  1.74*  --   --  2.00*  --   --   CREATININE 1.65*  --   --  1.23*  --   --   --   TROPONINI 0.32*  --  0.82* 0.81*  --  0.63*  --     Estimated Creatinine Clearance: 31.7 mL/min (A) (by C-G formula based on SCr of 1.23 mg/dL (H)).   Medical History: Past Medical History:  Diagnosis Date  . Allergy   . Arrhythmia   . Arthritis   . Atrial fibrillation ([place])   . Breast cancer ([place]) 2006   rt mastectomy  . Chronic kidney disease    chronic uti, kidney stones  . GERD (gastroesophageal reflux disease)   . Glaucoma   . HLD (hyperlipidemia)   . Hypertension   . 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 Personal history of chemotherapy   . Personal history of radiation therapy   . 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  (last dose 11/4 @ 1000) HL elevated at baseline  Choosing to not bolus per protocol and conversation with admitting MD        Goal of Therapy:  Heparin level 0.3-0.7 units/ml Monitor platelets by anticoagulation protocol: Yes   Plan:  APTT is subtherapeutic at 64. Will increase the rate to 1150 units/hr and recheck in 8 hours.  Ramond Dial, PharmD Clinical Pharmacist 09/28/2018 3:45 PM

## 2018-09-28 NOTE — Plan of Care (Signed)
  Problem: Education: Goal: Knowledge of General Education information will improve Description: Including pain rating scale, medication(s)/side effects and non-pharmacologic comfort measures Outcome: Progressing   Problem: Elimination: Goal: Will not experience complications related to bowel motility Outcome: Progressing   Problem: Skin Integrity: Goal: Risk for impaired skin integrity will decrease Outcome: Progressing   

## 2018-09-28 NOTE — Progress Notes (Signed)
PT Cancellation Note  Patient Details Name: Sandy Hickman MRN: [id number] DOB: 02/25/1934   Cancelled Treatment:    Reason Eval/Treat Not Completed: Patient not medically ready;Other (comment)(Per vital flowsheet, BP still low, PT evaluation held until patient is more medically appropriate.)   Lieutenant Diego PT, DPT 3:33 PM,09/28/18 [phone]

## 2018-09-28 NOTE — Progress Notes (Signed)
Pt given ativan IV 1 mg prior to MRI. Per pt she is claustrophobic. Pt BP holding steady but soft,  HR is Afib in the 80-90's. Cardizem titrated down to 5 mg, BP increased somewhat and HR sustaining 80-90's. Pt has no c/o pain, no concerns offered at this time. Will continue to monitor.

## 2018-09-28 NOTE — Consult Note (Signed)
Sandy Hickman  [phone]) [phone] Fax:([phone]  ID: Sandy Hickman OB: [birth year]  MR#: [id number]  WEX#:[id number]  Patient Care Team: Ezequiel Kayser, MD as PCP - General (Internal Medicine)  CHIEF COMPLAINT: History of extranodal marginal zone lymphoma, hypercalcemia, liver mass.  INTERVAL HISTORY: Patient is an 84 year old female who was recently admitted to the hospital with atrial fibrillation and rapid ventricular response.  Her daughter reported a significant decline in her health over the past 3 weeks.  She was noted to have hypercalcemia as well as abnormal bone marrow signal on MRI.  Subsequent CT scan revealed a large liver mass that was not appreciable on previous imaging in June 2019.  Patient is lethargic, but arousable.  Review of systems is difficult.  REVIEW OF SYSTEMS:   Review of Systems  Unable to perform ROS: Acuity of condition    PAST MEDICAL HISTORY: Past Medical History:  Diagnosis Date  . Allergy   . Arrhythmia   . Arthritis   . Atrial fibrillation ([place])   . Breast cancer ([place]) 2006   rt mastectomy  . Chronic kidney disease    chronic uti, kidney stones  . GERD (gastroesophageal reflux disease)   . Glaucoma   . HLD (hyperlipidemia)   . Hypertension   . Marginal zone lymphoma ([place])    left orbit  . Personal history of chemotherapy   . Personal history of radiation therapy   . 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 ABDOMINAL HYSTERECTOMY    . BLADDER SUSPENSION     15 years ago  . BREAST SURGERY    . cataract surgery Bilateral   . CHOLECYSTECTOMY    . JOINT REPLACEMENT    . KNEE SURGERY Right    2016  . MASTECTOMY Right 2006   rt mast /rad/chemo  . ROTATOR CUFF REPAIR     13 years    FAMILY HISTORY: Family History  Problem Relation Age of Onset  . Breast cancer Daughter 37  . Hypertension Father   . Stroke  Father   . Diabetes Sister   . Breast cancer Cousin        pat cousin  . Kidney cancer Son   . Prostate cancer Neg Hx   . Bladder Cancer Neg Hx     ADVANCED DIRECTIVES (Y/N):  @ADVDIR @  HEALTH MAINTENANCE: Social History   Tobacco Use  . Smoking status: Never Smoker  . Smokeless tobacco: Never Used  Substance Use Topics  . Alcohol use: Yes    Comment: Occasional  . Drug use: No     Colonoscopy:  PAP:  Bone density:  Lipid panel:  Allergies  Allergen Reactions  . Neosporin [Neomycin-Bacitracin Zn-Polymyx] Dermatitis  . Sulfa Antibiotics Itching and Rash  . Oxycodone-Acetaminophen Itching    Current Facility-Administered Medications  Medication Dose Route Frequency Provider Last Rate Last Dose  . 0.9 %  sodium chloride infusion   Intravenous Continuous Henreitta Leber, MD 50 mL/hr at 09/28/18 1410    . acetaminophen (TYLENOL) tablet 650 mg  650 mg Oral Q6H PRN Gladstone Lighter, MD       Or  . acetaminophen (TYLENOL) suppository 650 mg  650 mg Rectal Q6H PRN Gladstone Lighter, MD      . acidophilus (RISAQUAD) capsule 1 capsule  1 capsule Oral Daily Gladstone Lighter, MD   1 capsule at 09/28/18 0858  . feeding supplement (  ENSURE ENLIVE) (ENSURE ENLIVE) liquid 237 mL  237 mL Oral TID BM Henreitta Leber, MD   237 mL at 09/28/18 1116  . ferrous sulfate tablet 325 mg  325 mg Oral Q breakfast Gladstone Lighter, MD   325 mg at 09/28/18 0857  . gabapentin (NEURONTIN) capsule 300 mg  300 mg Oral QHS Gladstone Lighter, MD      . heparin ADULT infusion 100 units/mL (25000 units/233mL sodium chloride 0.45%)  1,150 Units/hr Intravenous Continuous Marcelle Overlie D, RPH 11.5 mL/hr at 09/28/18 1623 1,150 Units/hr at 09/28/18 1623  . latanoprost (XALATAN) 0.005 % ophthalmic solution 1 drop  1 drop Both Eyes QHS Gladstone Lighter, MD   1 drop at 09/27/18 2221  . loratadine (CLARITIN) tablet 10 mg  10 mg Oral Daily PRN Gladstone Lighter, MD      . Melatonin TABS   Oral QHS  Gladstone Lighter, MD      . metoprolol succinate (TOPROL-XL) 24 hr tablet 25 mg  25 mg Oral Daily Gladstone Lighter, MD   25 mg at 09/28/18 0941  . mirtazapine (REMERON) tablet 15 mg  15 mg Oral QHS Gladstone Lighter, MD      . multivitamin with minerals tablet 1 tablet  1 tablet Oral Daily Gladstone Lighter, MD   1 tablet at 09/28/18 0858  . omega-3 acid ethyl esters (LOVAZA) capsule 1 g  1 g Oral Daily Gladstone Lighter, MD   1 g at 09/28/18 0858  . ondansetron (ZOFRAN) tablet 4 mg  4 mg Oral Q6H PRN Gladstone Lighter, MD       Or  . ondansetron (ZOFRAN) injection 4 mg  4 mg Intravenous Q6H PRN Gladstone Lighter, MD      . pantoprazole (PROTONIX) EC tablet 40 mg  40 mg Oral Daily Gladstone Lighter, MD   40 mg at 09/28/18 0857  . timolol (TIMOPTIC) 0.5 % ophthalmic solution 1 drop  1 drop Both Eyes Daily Gladstone Lighter, MD   1 drop at 09/28/18 0900  . traMADol (ULTRAM) tablet 50 mg  50 mg Oral Q6H PRN Gladstone Lighter, MD   50 mg at 09/28/18 0941  . vitamin B-12 (CYANOCOBALAMIN) tablet 500 mcg  500 mcg Oral Daily Gladstone Lighter, MD   500 mcg at 09/28/18 0858  . vitamin C (ASCORBIC ACID) tablet 500 mg  500 mg Oral Daily Gladstone Lighter, MD   500 mg at 09/28/18 0858  . zolendronic acid (ZOMETA) 4 mg in sodium chloride 0.9 % 100 mL IVPB  4 mg Intravenous Once Lloyd Huger, MD      . zolpidem (AMBIEN) tablet 5 mg  5 mg Oral QHS PRN Lance Coon, MD        OBJECTIVE: Vitals:   09/28/18 1138 09/28/18 1409  BP: (!) 98/52 96/64  Pulse: 87 87  Resp:  18  Temp:  97.7 F (36.5 C)  SpO2: 96% 97%     Body mass index is 24.41 kg/m.    ECOG FS:4 - Bedbound  General: Well-developed, well-nourished, no acute distress. Eyes: Pink conjunctiva, anicteric sclera. HEENT: Normocephalic, moist mucous membranes, clear oropharnyx. Lungs: Clear to auscultation bilaterally. Heart: Regular rate and rhythm. No rubs, murmurs, or gallops. Abdomen: Soft, nontender, nondistended. No  organomegaly noted, normoactive bowel sounds. Musculoskeletal: No edema, cyanosis, or clubbing. Neuro: Alert, answering all questions appropriately. Cranial nerves grossly intact. Skin: No rashes or petechiae noted. Psych: Normal affect. Lymphatics: No cervical, calvicular, axillary or inguinal LAD.   LAB RESULTS:  Lab Results  Component  Value Date   NA 140 09/28/2018   K 4.2 09/28/2018   CL 109 09/28/2018   CO2 24 09/28/2018   GLUCOSE 141 (H) 09/28/2018   BUN 44 (H) 09/28/2018   CREATININE 1.23 (H) 09/28/2018   CALCIUM 11.8 (H) 09/28/2018   PROT 6.8 09/27/2018   ALBUMIN 2.9 (L) 09/27/2018   AST 47 (H) 09/27/2018   ALT 43 09/27/2018   ALKPHOS 256 (H) 09/27/2018   BILITOT 1.3 (H) 09/27/2018   GFRNONAA 40 (L) 09/28/2018   GFRAA 46 (L) 09/28/2018    Lab Results  Component Value Date   WBC 16.2 (H) 09/28/2018   NEUTROABS 18.4 (H) 09/27/2018   HGB 11.7 (L) 09/28/2018   HCT 36.4 09/28/2018   MCV 93.1 09/28/2018   PLT 149 (L) 09/28/2018     STUDIES: Dg Thoracic Spine 2 View  Result Date: 09/27/2018 CLINICAL DATA:  Weakness, back pain EXAM: THORACIC SPINE 2 VIEWS COMPARISON:  CT 05/04/2018 FINDINGS: Thoracic alignment is within normal limits. The vertebral body heights are maintained. Questionable erosions on the left side of the T6 and T7 vertebral bodies. IMPRESSION: 1. Questionable erosive change along the left aspect of the T6 and T7 vertebral bodies. CT could be obtained for further evaluation. Electronically Signed   By: Donavan Foil M.D.   On: 09/27/2018 16:53   Dg Lumbar Spine 2-3 Views  Result Date: 09/27/2018 CLINICAL DATA:  Weakness with back pain EXAM: LUMBAR SPINE - 2-3 VIEW COMPARISON:  CT 08/10/2015 FINDINGS: Trace retrolisthesis L2 on L3 and L3 on L4. Moderate severe degenerative changes L3-L4, L1-L2 and L2-L3. Levoscoliosis of the lumbar spine. IMPRESSION: Scoliosis and marked multiple level degenerative change without acute osseous abnormality.  Electronically Signed   By: Donavan Foil M.D.   On: 09/27/2018 17:12   Ct Head Wo Contrast  Result Date: 09/27/2018 CLINICAL DATA:  84 y/o  F; fall on Eliquis. Generalized weakness. EXAM: CT HEAD WITHOUT CONTRAST TECHNIQUE: Contiguous axial images were obtained from the base of the skull through the vertex without intravenous contrast. COMPARISON:  05/04/2018 MRI of the head. FINDINGS: Brain: No evidence of acute infarction, hemorrhage, hydrocephalus, extra-axial collection or mass lesion/mass effect of the brain parenchyma. Stable right lateral frontal meningioma measuring 16 mm (series 4, image 22). Few stable nonspecific white matter hypodensities compatible with mild chronic microvascular ischemic changes. Stable mild volume loss of the brain. Vascular: Mild calcific atherosclerosis of the carotid siphons. Skull: Normal. Negative for fracture or focal lesion. Sinuses/Orbits: No acute finding. Other: Borderline high-riding jugular bulb. IMPRESSION: 1. No acute intracranial abnormality. 2. Stable mild chronic microvascular ischemic changes and mild volume loss of the brain. 3. Stable right lateral frontal region meningioma. Electronically Signed   By: Kristine Garbe M.D.   On: 09/27/2018 13:57   Mr Thoracic Spine Wo Contrast  Result Date: 09/27/2018 CLINICAL DATA:  Suspected bone destruction on prior radiograph EXAM: MRI THORACIC SPINE WITHOUT CONTRAST TECHNIQUE: Multiplanar, multisequence MR imaging of the thoracic spine was performed. No intravenous contrast was administered. COMPARISON:  Thoracic spine radiograph 09/27/2018 Chest CT 05/04/2018 FINDINGS: Alignment:  Normal Vertebrae: There is heterogeneous marrow signal throughout the thoracic spine. There are numerous low T1 weighted signal lesions with associated edema on the inversion recovery images. The largest of these is at T7, but most of the thoracic levels are involved. Cord:  Normal signal and morphology. Paraspinal and other soft  tissues: There are numerous para-aortic retroperitoneal lymph nodes. Disc levels: No spinal canal stenosis. IMPRESSION: 1. Diffusely abnormal bone  marrow signal throughout the thoracic spine, which may be secondary to lymphomatous infiltration of bone marrow. Postcontrast imaging may be helpful to better define the extent. 2. No pathologic fracture. 3. Numerous enlarged retroperitoneal lymph nodes. CT or PET CT is recommended to better characterize extent. Electronically Signed   By: Ulyses Jarred M.D.   On: 09/27/2018 23:58   Dg Chest Portable 1 View  Result Date: 09/27/2018 CLINICAL DATA:  Weakness and fall.  Tachycardia. EXAM: PORTABLE CHEST 1 VIEW COMPARISON:  07/02/2013 radiographs. 09/15/2018 chest radiograph report, but images are not available to view. FINDINGS: The cardiomediastinal silhouette is unremarkable. Interstitial prominence bilaterally noted. Elevation of the RIGHT hemidiaphragm and surgical clips in the RIGHT axillary region again identified. No pleural effusion, pneumothorax or definite airspace disease. No acute bony abnormalities are identified. IMPRESSION: Mild interstitial prominence of uncertain chronicity. Infection or mild edema are considerations if acute. No other significant abnormalities. Electronically Signed   By: Margarette Canada M.D.   On: 09/27/2018 14:01    ASSESSMENT: History of extranodal marginal zone lymphoma, hypercalcemia, liver mass.  PLAN:    1.  Extranodal marginal zone lymphoma: Patient's disease was isolated in the ocular adnexa and only required treatment with XRT.  These are typically indolent forms of lymphoma.  CT scan results reviewed independently, although official radiology read is pending at time of dictation.  Patient noted to have a large liver mass that does not appear to be present on previous imaging.  This would be an unusual site of recurrence of lymphoma, but possible.  Have ordered a ultrasound-guided biopsy to confirm the diagnosis.  Case  discussed with hospitalist as well as pathology. 2.  Liver mass: Highly suspicious for malignancy, but unclear if this is a recurrence of lymphoma or a second primary.  Ultrasound-guided biopsy tomorrow as above. 3.  Hypercalcemia: Likely secondary to underlying malignancy.  Continue IV fluids.  Have ordered 4 mg IV Zometa.  Continue to monitor daily metabolic panel. 4.  Atrial fibrillation: Patient is currently on heparin drip which will need to be discontinued prior to her procedure. 5.  Leukocytosis: Likely reactive, monitor. 6.  Renal insufficiency: Mild, monitor.  Appreciate consult, will follow.  Cancer Staging Extranodal marginal zone B-cell lymphoma (HCC) Staging form: Hodgkin and Non-Hodgkin Lymphoma, AJCC 8th Edition - Clinical stage from 07/31/2017: Stage IE (Marginal zone lymphoma) - Signed by Lloyd Huger, MD on 07/31/2017   Lloyd Huger, MD   09/28/2018 4:43 PM

## 2018-09-28 NOTE — Progress Notes (Signed)
PT Cancellation Note  Patient Details Name: Sandy Hickman MRN: [id number] DOB: Dec 18, 1933   Cancelled Treatment:    Reason Eval/Treat Not Completed: Fatigue/lethargy limiting ability to participate;Patient not medically ready;Other (comment)(Physician and nursing staff in room upon entering. Pt BP 96/66 in supine, pt complaining of weakness, BP medication just initiated. PT will re-attempt this afternoon as able. )  Lieutenant Diego PT, DPT 10:56 AM,09/28/18 [phone]

## 2018-09-28 NOTE — Progress Notes (Signed)
[facility] [place] NAME: Sandy Hickman    MR#:  [id number]  DATE OF BIRTH:  04/12/1934  SUBJECTIVE:   Patient here after a fall and noted to be in atrial fibrillation with rapid ventricular response.  Patient was on a Cardizem drip but now somewhat hypotensive Cardizem drip stopped.  Heart rates have improved, patient's daughter is at bedside and is concerned about patient's declining health over the past 3 months.  Patient was independent but now is unable to get around as much and also complaining of some back pain with poor p.o. intake.  REVIEW OF SYSTEMS:    Review of Systems  Constitutional: Positive for malaise/fatigue. Negative for chills and fever.  HENT: Negative for congestion and tinnitus.   Eyes: Negative for blurred vision and double vision.  Respiratory: Negative for cough, shortness of breath and wheezing.   Cardiovascular: Negative for chest pain, orthopnea and PND.  Gastrointestinal: Negative for abdominal pain, diarrhea, nausea and vomiting.  Genitourinary: Negative for dysuria and hematuria.  Musculoskeletal: Positive for back pain.  Neurological: Positive for weakness (generalized). Negative for dizziness, sensory change and focal weakness.  All other systems reviewed and are negative.   Nutrition: Heart healthy Tolerating Diet: Yes Tolerating PT: Await Eval.   DRUG ALLERGIES:   Allergies  Allergen Reactions  . Neosporin [Neomycin-Bacitracin Zn-Polymyx] Dermatitis  . Sulfa Antibiotics Itching and Rash  . Oxycodone-Acetaminophen Itching    VITALS:  Blood pressure 96/64, pulse 87, temperature 97.7 F (36.5 C), temperature source Oral, resp. rate 18, height 5\' 5"  (1.651 m), weight 66.5 kg, SpO2 97 %.  PHYSICAL EXAMINATION:   Physical Exam  GENERAL:  84 y.o.-year-old pale appearing patient lying in bed in no acute distress.  EYES: Pupils equal, round, reactive to light and accommodation. No scleral  icterus. Extraocular muscles intact.  HEENT: Head atraumatic, normocephalic. Oropharynx and nasopharynx clear.  NECK:  Supple, no jugular venous distention. No thyroid enlargement, no tenderness.  LUNGS: Normal breath sounds bilaterally, no wheezing, rales, rhonchi. No use of accessory muscles of respiration.  CARDIOVASCULAR: S1, S2 normal. No murmurs, rubs, or gallops.  ABDOMEN: Soft, nontender, nondistended. Bowel sounds present. No organomegaly or mass.  EXTREMITIES: No cyanosis, clubbing or edema b/l.    NEUROLOGIC: Cranial nerves II through XII are intact. No focal Motor or sensory deficits b/l. Globally weak.   PSYCHIATRIC: The patient is alert and oriented x 3.  SKIN: No obvious rash, lesion, or ulcer.    LABORATORY PANEL:   CBC Recent Labs  Lab 09/28/18 0356  WBC 16.2*  HGB 11.7*  HCT 36.4  PLT 149*   ------------------------------------------------------------------------------------------------------------------  Chemistries  Recent Labs  Lab 09/27/18 1337 09/28/18 0356  NA 138 140  K 4.6 4.2  CL 104 109  CO2 18* 24  GLUCOSE 288* 141*  BUN 47* 44*  CREATININE 1.65* 1.23*  CALCIUM 12.9* 11.8*  AST 47*  --   ALT 43  --   ALKPHOS 256*  --   BILITOT 1.3*  --    ------------------------------------------------------------------------------------------------------------------  Cardiac Enzymes Recent Labs  Lab 09/28/18 0952  TROPONINI 0.63*   ------------------------------------------------------------------------------------------------------------------  RADIOLOGY:  Dg Thoracic Spine 2 View  Result Date: 09/27/2018 CLINICAL DATA:  Weakness, back pain EXAM: THORACIC SPINE 2 VIEWS COMPARISON:  CT 05/04/2018 FINDINGS: Thoracic alignment is within normal limits. The vertebral body heights are maintained. Questionable erosions on the left side of the T6 and T7 vertebral bodies. IMPRESSION: 1. Questionable erosive  change along the left aspect of the T6 and T7  vertebral bodies. CT could be obtained for further evaluation. Electronically Signed   By: Donavan Foil M.D.   On: 09/27/2018 16:53   Dg Lumbar Spine 2-3 Views  Result Date: 09/27/2018 CLINICAL DATA:  Weakness with back pain EXAM: LUMBAR SPINE - 2-3 VIEW COMPARISON:  CT 08/10/2015 FINDINGS: Trace retrolisthesis L2 on L3 and L3 on L4. Moderate severe degenerative changes L3-L4, L1-L2 and L2-L3. Levoscoliosis of the lumbar spine. IMPRESSION: Scoliosis and marked multiple level degenerative change without acute osseous abnormality. Electronically Signed   By: Donavan Foil M.D.   On: 09/27/2018 17:12   Ct Head Wo Contrast  Result Date: 09/27/2018 CLINICAL DATA:  84 y/o  F; fall on Eliquis. Generalized weakness. EXAM: CT HEAD WITHOUT CONTRAST TECHNIQUE: Contiguous axial images were obtained from the base of the skull through the vertex without intravenous contrast. COMPARISON:  05/04/2018 MRI of the head. FINDINGS: Brain: No evidence of acute infarction, hemorrhage, hydrocephalus, extra-axial collection or mass lesion/mass effect of the brain parenchyma. Stable right lateral frontal meningioma measuring 16 mm (series 4, image 22). Few stable nonspecific white matter hypodensities compatible with mild chronic microvascular ischemic changes. Stable mild volume loss of the brain. Vascular: Mild calcific atherosclerosis of the carotid siphons. Skull: Normal. Negative for fracture or focal lesion. Sinuses/Orbits: No acute finding. Other: Borderline high-riding jugular bulb. IMPRESSION: 1. No acute intracranial abnormality. 2. Stable mild chronic microvascular ischemic changes and mild volume loss of the brain. 3. Stable right lateral frontal region meningioma. Electronically Signed   By: Kristine Garbe M.D.   On: 09/27/2018 13:57   Mr Thoracic Spine Wo Contrast  Result Date: 09/27/2018 CLINICAL DATA:  Suspected bone destruction on prior radiograph EXAM: MRI THORACIC SPINE WITHOUT CONTRAST TECHNIQUE:  Multiplanar, multisequence MR imaging of the thoracic spine was performed. No intravenous contrast was administered. COMPARISON:  Thoracic spine radiograph 09/27/2018 Chest CT 05/04/2018 FINDINGS: Alignment:  Normal Vertebrae: There is heterogeneous marrow signal throughout the thoracic spine. There are numerous low T1 weighted signal lesions with associated edema on the inversion recovery images. The largest of these is at T7, but most of the thoracic levels are involved. Cord:  Normal signal and morphology. Paraspinal and other soft tissues: There are numerous para-aortic retroperitoneal lymph nodes. Disc levels: No spinal canal stenosis. IMPRESSION: 1. Diffusely abnormal bone marrow signal throughout the thoracic spine, which may be secondary to lymphomatous infiltration of bone marrow. Postcontrast imaging may be helpful to better define the extent. 2. No pathologic fracture. 3. Numerous enlarged retroperitoneal lymph nodes. CT or PET CT is recommended to better characterize extent. Electronically Signed   By: Ulyses Jarred M.D.   On: 09/27/2018 23:58   Dg Chest Portable 1 View  Result Date: 09/27/2018 CLINICAL DATA:  Weakness and fall.  Tachycardia. EXAM: PORTABLE CHEST 1 VIEW COMPARISON:  07/02/2013 radiographs. 09/15/2018 chest radiograph report, but images are not available to view. FINDINGS: The cardiomediastinal silhouette is unremarkable. Interstitial prominence bilaterally noted. Elevation of the RIGHT hemidiaphragm and surgical clips in the RIGHT axillary region again identified. No pleural effusion, pneumothorax or definite airspace disease. No acute bony abnormalities are identified. IMPRESSION: Mild interstitial prominence of uncertain chronicity. Infection or mild edema are considerations if acute. No other significant abnormalities. Electronically Signed   By: Margarette Canada M.D.   On: 09/27/2018 14:01     ASSESSMENT AND PLAN:   84 yo female w/ hx of Breast Cancer, Marginal Zone Lymphoma,  HTN, hx  of A. Fib, hx of Neuropathy, Glaucoma, who presented to the hospital who presented to the hospital due to weakness and falls and noted to be in a. Fib w/ RVR.    1.  Atrial fibrillation with rapid ventricular response- patient presented with elevated heart rates but now is somewhat hypotensive and therefore Cardizem drip has been stopped. - Continue Toprol.  -Continue heparin drip.   2.  Back pain-patient has been having back pain with recurrent falls which has progressively gotten worse.  She had a MRI of the thoracic spine done which shows diffusely abnormal bone marrow through the entire thoracic spine suggestive of lymphomatous infiltration.  Patient has a previous history of marginal cell lymphoma. - Discussed with oncology Dr. Grayland Ormond who recommended getting a CT scan of the chest abdomen pelvis which is currently pending.  Await further oncology input.  Patient likely could have recurrence of her malignancy.  3.  Hypercalcemia-mild.  Continue IV fluids, follow calcium level. - Oncology to give Zometa one dose today.   4.  Glaucoma-continue Latanoprost, Timolol eye drops.   5. GERD - cont. Protonix.   6. Neuropathy - cont. Neurontin.   Greater than 50% of time spent in coordinating care with patient, daughter and also hematology oncology  All the records are reviewed and case discussed with Care Management/Social Worker. Management plans discussed with the patient, family and they are in agreement.  CODE STATUS: Full code  DVT Prophylaxis: Hep. gtt  TOTAL TIME TAKING CARE OF THIS PATIENT: 45 minutes.   POSSIBLE D/C IN 2-3 DAYS, DEPENDING ON CLINICAL CONDITION.   Henreitta Leber M.D on 09/28/2018 at 4:00 PM  Between 7am to 6pm - Pager - [phone]  After 6pm go to [email or web address] - [employer] [facility]  Office  [phone]  CC: Primary care physician; Ezequiel Kayser, MD

## 2018-09-28 NOTE — Progress Notes (Signed)
[facility] Encounter Note  Patient: Sandy Hickman / Admit Date: 09/27/2018 / Date of Encounter: 09/28/2018, 5:08 PM   Subjective: Patient continues to be weak fatigued and lethargic.  No improvements at this time.  Liver mass has been seen and will be evaluated in the near future for possible causes of current symptoms.  Atrial fibrillation with controlled ventricular rate with metoprolol.  No evidence of bleeding at this time  Review of Systems: Positive for: This fatigue Negative for: Vision change, hearing change, syncope, dizziness, nausea, vomiting,diarrhea, bloody stool, stomach pain, cough, congestion, diaphoresis, urinary frequency, urinary pain,skin lesions, skin rashes Others previously listed  Objective: Telemetry: Atrial fibrillation with controlled ventricular rate Physical Exam: Blood pressure 96/64, pulse 87, temperature 97.7 F (36.5 C), temperature source Oral, resp. rate 18, height 5\' 5"  (1.651 m), weight 66.5 kg, SpO2 97 %. Body mass index is 24.41 kg/m. General: Well developed, well nourished, in no acute distress. Head: Normocephalic, atraumatic, sclera non-icteric, no xanthomas, nares are without discharge. Neck: No apparent masses Lungs: Normal respirations with no wheezes, no rhonchi, no rales , no crackles   Heart: Irregular rate and rhythm, normal S1 S2, no murmur, no rub, no gallop, PMI is normal size and placement, carotid upstroke normal without bruit, jugular venous pressure normal Abdomen: Soft, non-tender, non-distended with normoactive bowel sounds. No hepatosplenomegaly. Abdominal aorta is normal size without bruit Extremities: Trace edema, no clubbing, no cyanosis, no ulcers,  Peripheral: 2+ radial, 2+ femoral, 2+ dorsal pedal pulses Neuro: Alert and oriented. Moves all extremities spontaneously. Psych:  Responds to questions appropriately with a normal affect.   Intake/Output Summary (Last 24 hours) at 09/28/2018 1708 Last  data filed at 09/28/2018 1410 Gross per 24 hour  Intake 309.08 ml  Output 601 ml  Net -291.92 ml    Inpatient Medications:  . acidophilus  1 capsule Oral Daily  . feeding supplement (ENSURE ENLIVE)  237 mL Oral TID BM  . ferrous sulfate  325 mg Oral Q breakfast  . gabapentin  300 mg Oral QHS  . latanoprost  1 drop Both Eyes QHS  . Melatonin   Oral QHS  . metoprolol succinate  25 mg Oral Daily  . mirtazapine  15 mg Oral QHS  . multivitamin with minerals  1 tablet Oral Daily  . omega-3 acid ethyl esters  1 g Oral Daily  . pantoprazole  40 mg Oral Daily  . timolol  1 drop Both Eyes Daily  . vitamin B-12  500 mcg Oral Daily  . ascorbic acid  500 mg Oral Daily   Infusions:  . sodium chloride 50 mL/hr at 09/28/18 1410  . heparin 1,150 Units/hr (09/28/18 1623)  . zoledronic acid (ZOMETA) IV      Labs: Recent Labs    09/27/18 1337 09/28/18 0356  NA 138 140  K 4.6 4.2  CL 104 109  CO2 18* 24  GLUCOSE 288* 141*  BUN 47* 44*  CREATININE 1.65* 1.23*  CALCIUM 12.9* 11.8*   Recent Labs    09/27/18 1337  AST 47*  ALT 43  ALKPHOS 256*  BILITOT 1.3*  PROT 6.8  ALBUMIN 2.9*   Recent Labs    09/27/18 1337 09/28/18 0356  WBC 23.0* 16.2*  NEUTROABS 18.4*  --   HGB 13.9 11.7*  HCT 44.9 36.4  MCV 96.8 93.1  PLT 177 149*   Recent Labs    09/27/18 1337 09/27/18 2129 09/28/18 0356 09/28/18 0952  TROPONINI 0.32* 0.82* 0.81* 0.63*  Invalid input(s): POCBNP No results for input(s): HGBA1C in the last 72 hours.   Weights: Filed Weights   09/27/18 1313 09/28/18 0420  Weight: 68 kg 66.5 kg     Radiology/Studies:  Dg Thoracic Spine 2 View  Result Date: 09/27/2018 CLINICAL DATA:  Weakness, back pain EXAM: THORACIC SPINE 2 VIEWS COMPARISON:  CT 05/04/2018 FINDINGS: Thoracic alignment is within normal limits. The vertebral body heights are maintained. Questionable erosions on the left side of the T6 and T7 vertebral bodies. IMPRESSION: 1. Questionable erosive change  along the left aspect of the T6 and T7 vertebral bodies. CT could be obtained for further evaluation. Electronically Signed   By: Donavan Foil M.D.   On: 09/27/2018 16:53   Dg Lumbar Spine 2-3 Views  Result Date: 09/27/2018 CLINICAL DATA:  Weakness with back pain EXAM: LUMBAR SPINE - 2-3 VIEW COMPARISON:  CT 08/10/2015 FINDINGS: Trace retrolisthesis L2 on L3 and L3 on L4. Moderate severe degenerative changes L3-L4, L1-L2 and L2-L3. Levoscoliosis of the lumbar spine. IMPRESSION: Scoliosis and marked multiple level degenerative change without acute osseous abnormality. Electronically Signed   By: Donavan Foil M.D.   On: 09/27/2018 17:12   Ct Head Wo Contrast  Result Date: 09/27/2018 CLINICAL DATA:  84 y/o  F; fall on Eliquis. Generalized weakness. EXAM: CT HEAD WITHOUT CONTRAST TECHNIQUE: Contiguous axial images were obtained from the base of the skull through the vertex without intravenous contrast. COMPARISON:  05/04/2018 MRI of the head. FINDINGS: Brain: No evidence of acute infarction, hemorrhage, hydrocephalus, extra-axial collection or mass lesion/mass effect of the brain parenchyma. Stable right lateral frontal meningioma measuring 16 mm (series 4, image 22). Few stable nonspecific white matter hypodensities compatible with mild chronic microvascular ischemic changes. Stable mild volume loss of the brain. Vascular: Mild calcific atherosclerosis of the carotid siphons. Skull: Normal. Negative for fracture or focal lesion. Sinuses/Orbits: No acute finding. Other: Borderline high-riding jugular bulb. IMPRESSION: 1. No acute intracranial abnormality. 2. Stable mild chronic microvascular ischemic changes and mild volume loss of the brain. 3. Stable right lateral frontal region meningioma. Electronically Signed   By: Kristine Garbe M.D.   On: 09/27/2018 13:57   Ct Chest W Contrast  Result Date: 09/28/2018 CLINICAL DATA:  Remote history of breast cancer. EXAM: CT CHEST, ABDOMEN, AND PELVIS WITH  CONTRAST TECHNIQUE: Multidetector CT imaging of the chest, abdomen and pelvis was performed following the standard protocol during bolus administration of intravenous contrast. CONTRAST:  17mL OMNIPAQUE IOHEXOL 300 MG/ML  SOLN COMPARISON:  CT 05/04/2018 FINDINGS: CT CHEST FINDINGS Cardiovascular: No significant vascular findings. Normal heart size. No pericardial effusion. Mediastinum/Nodes: Post RIGHT mastectomy. No axillary adenopathy. Enlarged LEFT supraclavicular lymph node measures 14 mm diameter (image 4/2). Enlarged precarinal lymph node measures 12 mm short axis. Large subcarinal nodes measure up to 19 mm short axis. These nodes have developed in short interval and were not present on CT 05/04/2018 Lungs/Pleura: multiple small bilateral pulmonary nodules. There is interlobular septal thickening LEFT and RIGHT lung. The nodules are too numerous to count and new from comparison exam. Musculoskeletal: No aggressive osseous lesion. CT ABDOMEN AND PELVIS FINDINGS Hepatobiliary: \large irregular lesion in the posterior RIGHT hepatic lobe measures 7.3 by 5.9 cm is new from comparison exam. Bulky periportal adenopathy with lymph nodes measuring up to 2.7 cm short axis. Pancreas: Pancreas is normal. No ductal dilatation. No pancreatic inflammation. Spleen: Normal spleen Adrenals/urinary tract: Adrenal glands and kidneys are normal. The ureters and bladder normal. Stomach/Bowel: Stomach, small bowel, appendix, and  cecum are normal. The colon and rectosigmoid colon are normal. Vascular/Lymphatic: Abdominal aorta normal caliber. There is extensive upper abdominal retroperitoneal adenopathy and retrocrural adenopathy. Example lymph node posterior to the IVC at the level the renal veins measures 17 mm short axis. Reproductive: Post hysterectomy no adnexal abnormality Other: No peritoneal metastasis. Musculoskeletal: No aggressive osseous lesion degenerate change of the lower lumbar spine IMPRESSION: Chest Impression: 1.  New extensive mediastinal and supraclavicular adenopathy consistent with metastatic disease. 2. New bilateral pulmonary nodules air and interlobular septal thickening suggest metastatic pulmonary nodules and lymphangitic spread of carcinoma. 3. Post RIGHT mastectomy anatomy. Abdomen / Pelvis Impression: 1. Large irregular lesion in the posterior RIGHT hepatic lobe consistent metastatic leison. 2. Bulky periportal, retroperitoneal and retrocrural lymphadenopathy in the abdomen. Electronically Signed   By: Suzy Bouchard M.D.   On: 09/28/2018 16:54   Mr Thoracic Spine Wo Contrast  Result Date: 09/27/2018 CLINICAL DATA:  Suspected bone destruction on prior radiograph EXAM: MRI THORACIC SPINE WITHOUT CONTRAST TECHNIQUE: Multiplanar, multisequence MR imaging of the thoracic spine was performed. No intravenous contrast was administered. COMPARISON:  Thoracic spine radiograph 09/27/2018 Chest CT 05/04/2018 FINDINGS: Alignment:  Normal Vertebrae: There is heterogeneous marrow signal throughout the thoracic spine. There are numerous low T1 weighted signal lesions with associated edema on the inversion recovery images. The largest of these is at T7, but most of the thoracic levels are involved. Cord:  Normal signal and morphology. Paraspinal and other soft tissues: There are numerous para-aortic retroperitoneal lymph nodes. Disc levels: No spinal canal stenosis. IMPRESSION: 1. Diffusely abnormal bone marrow signal throughout the thoracic spine, which may be secondary to lymphomatous infiltration of bone marrow. Postcontrast imaging may be helpful to better define the extent. 2. No pathologic fracture. 3. Numerous enlarged retroperitoneal lymph nodes. CT or PET CT is recommended to better characterize extent. Electronically Signed   By: Ulyses Jarred M.D.   On: 09/27/2018 23:58   Ct Abdomen Pelvis W Contrast  Result Date: 09/28/2018 CLINICAL DATA:  Number remote history of breast cancer EXAM: CT ABDOMEN AND PELVIS  WITH CONTRAST TECHNIQUE: Multidetector CT imaging of the abdomen and pelvis was performed using the standard protocol following bolus administration of intravenous contrast. CONTRAST:  41mL OMNIPAQUE IOHEXOL 300 MG/ML  SOLN COMPARISON:  CT 05/04/2018 FINDINGS: CT CHEST FINDINGS Cardiovascular: No significant vascular findings. Normal heart size. No pericardial effusion. Mediastinum/Nodes: Post RIGHT mastectomy. No axillary adenopathy. Enlarged LEFT supraclavicular lymph node measures 14 mm diameter (image 4/2). Enlarged precarinal lymph node measures 12 mm short axis. Large subcarinal nodes measure up to 19 mm short axis. These nodes have developed in short interval and were not present on CT 05/04/2018 Lungs/Pleura: multiple small bilateral pulmonary nodules. There is interlobular septal thickening LEFT and RIGHT lung. The nodules are too numerous to count and new from comparison exam. Musculoskeletal: No aggressive osseous lesion. CT ABDOMEN AND PELVIS FINDINGS Hepatobiliary: \large irregular lesion in the posterior RIGHT hepatic lobe measures 7.3 by 5.9 cm is new from comparison exam. Bulky periportal adenopathy with lymph nodes measuring up to 2.7 cm short axis. Pancreas: Pancreas is normal. No ductal dilatation. No pancreatic inflammation. Spleen: Normal spleen Adrenals/urinary tract: Adrenal glands and kidneys are normal. The ureters and bladder normal. Stomach/Bowel: Stomach, small bowel, appendix, and cecum are normal. The colon and rectosigmoid colon are normal. Vascular/Lymphatic: Abdominal aorta normal caliber. There is extensive upper abdominal retroperitoneal adenopathy and retrocrural adenopathy. Example lymph node posterior to the IVC at the level the renal veins measures 17  mm short axis. Reproductive: Post hysterectomy no adnexal abnormality Other: No peritoneal metastasis. Musculoskeletal: No aggressive osseous lesion degenerate change of the lower lumbar spine IMPRESSION: Chest Impression: 1. New  extensive mediastinal and supraclavicular adenopathy consistent with metastatic disease. 2. New bilateral pulmonary nodules air and interlobular septal thickening suggest metastatic pulmonary nodules and lymphangitic spread of carcinoma. 3. Post RIGHT mastectomy anatomy. Abdomen / Pelvis Impression: 1. Large irregular lesion in the posterior RIGHT hepatic lobe consistent metastatic leison. 2. Bulky periportal, retroperitoneal and retrocrural lymphadenopathy in the abdomen. Electronically Signed   By: Suzy Bouchard M.D.   On: 09/28/2018 17:02   Dg Chest Portable 1 View  Result Date: 09/27/2018 CLINICAL DATA:  Weakness and fall.  Tachycardia. EXAM: PORTABLE CHEST 1 VIEW COMPARISON:  07/02/2013 radiographs. 09/15/2018 chest radiograph report, but images are not available to view. FINDINGS: The cardiomediastinal silhouette is unremarkable. Interstitial prominence bilaterally noted. Elevation of the RIGHT hemidiaphragm and surgical clips in the RIGHT axillary region again identified. No pleural effusion, pneumothorax or definite airspace disease. No acute bony abnormalities are identified. IMPRESSION: Mild interstitial prominence of uncertain chronicity. Infection or mild edema are considerations if acute. No other significant abnormalities. Electronically Signed   By: Margarette Canada M.D.   On: 09/27/2018 14:01     Assessment and Recommendation  84 y.o. female with atrial fibrillation with rapid ventricular rate causing mild pulmonary edema and elevated BNP with hypertension now improved with heart rate control but still lethargic weak and fatigued with liver mass possibly causing some extra symptoms 1.  Continue heart rate control with current medical regimen of metoprolol 2.  Heparin for further risk reduction in stroke 3.  Okay for discontinuation of heparin for evaluation of liver mass as necessary 4.  No further cardiac diagnostics necessary at this time  Signed, Serafina Royals M.D. FACC

## 2018-09-29 ENCOUNTER — Inpatient Hospital Stay: Payer: Medicare HMO

## 2018-09-29 DIAGNOSIS — R160 Hepatomegaly, not elsewhere classified: Secondary | ICD-10-CM

## 2018-09-29 DIAGNOSIS — Z515 Encounter for palliative care: Secondary | ICD-10-CM

## 2018-09-29 LAB — CBC
HCT: 39.1 % (ref 36.0–46.0)
Hemoglobin: 12.2 g/dL (ref 12.0–15.0)
MCH: 29.8 pg (ref 26.0–34.0)
MCHC: 31.2 g/dL (ref 30.0–36.0)
MCV: 95.6 fL (ref 80.0–100.0)
PLATELETS: 156 10*3/uL (ref 150–400)
RBC: 4.09 MIL/uL (ref 3.87–5.11)
RDW: 13.2 % (ref 11.5–15.5)
WBC: 16.9 10*3/uL — ABNORMAL HIGH (ref 4.0–10.5)
nRBC: 0 % (ref 0.0–0.2)

## 2018-09-29 LAB — BASIC METABOLIC PANEL
ANION GAP: 10 (ref 5–15)
BUN: 40 mg/dL — ABNORMAL HIGH (ref 8–23)
CALCIUM: 11.5 mg/dL — AB (ref 8.9–10.3)
CO2: 22 mmol/L (ref 22–32)
Chloride: 104 mmol/L (ref 98–111)
Creatinine, Ser: 0.93 mg/dL (ref 0.44–1.00)
GFR calc non Af Amer: 56 mL/min — ABNORMAL LOW (ref >60)
GLUCOSE: 149 mg/dL — AB (ref 70–99)
Potassium: 4 mmol/L (ref 3.5–5.1)
Sodium: 136 mmol/L (ref 135–145)

## 2018-09-29 LAB — HEPARIN LEVEL (UNFRACTIONATED): Heparin Unfractionated: 1.54 IU/mL — ABNORMAL HIGH (ref 0.30–0.70)

## 2018-09-29 LAB — APTT
APTT: 107 s — AB (ref 24–36)
APTT: 26 s (ref 24–36)
APTT: 85 s — AB (ref 24–36)

## 2018-09-29 LAB — PROTIME-INR
INR: 1.1
Prothrombin Time: 14.1 seconds (ref 11.4–15.2)

## 2018-09-29 MED ORDER — SODIUM CHLORIDE 0.9 % IV BOLUS
500.0000 mL | Freq: Once | INTRAVENOUS | Status: AC
  Administered 2018-09-29: 500 mL via INTRAVENOUS

## 2018-09-29 MED ORDER — MIDAZOLAM HCL 5 MG/5ML IJ SOLN
INTRAMUSCULAR | Status: AC | PRN
  Administered 2018-09-29: 1 mg via INTRAVENOUS

## 2018-09-29 MED ORDER — SODIUM CHLORIDE 0.9 % IV SOLN
INTRAVENOUS | Status: DC
  Administered 2018-09-29: 15:00:00 via INTRAVENOUS

## 2018-09-29 MED ORDER — HEPARIN (PORCINE) IN NACL 100-0.45 UNIT/ML-% IJ SOLN
1050.0000 [IU]/h | INTRAMUSCULAR | Status: DC
  Administered 2018-09-29 – 2018-09-30 (×3): 1050 [IU]/h via INTRAVENOUS
  Filled 2018-09-29 (×2): qty 250

## 2018-09-29 MED ORDER — MIDAZOLAM HCL 5 MG/5ML IJ SOLN
INTRAMUSCULAR | Status: AC
  Filled 2018-09-29: qty 5

## 2018-09-29 MED ORDER — FENTANYL CITRATE (PF) 100 MCG/2ML IJ SOLN
INTRAMUSCULAR | Status: AC
  Filled 2018-09-29: qty 4

## 2018-09-29 MED ORDER — SODIUM CHLORIDE 0.9 % IV BOLUS: 300.0000 mL | Freq: Once | INTRAVENOUS | Status: DC

## 2018-09-29 MED ORDER — SODIUM CHLORIDE 0.9 % IV SOLN: INTRAVENOUS | Status: DC

## 2018-09-29 MED ORDER — FENTANYL CITRATE (PF) 100 MCG/2ML IJ SOLN
INTRAMUSCULAR | Status: AC | PRN
  Administered 2018-09-29: 50 ug via INTRAVENOUS

## 2018-09-29 MED ORDER — SODIUM CHLORIDE 0.9 % IV BOLUS: 300.0000 mL | Freq: Once | INTRAVENOUS | Status: AC

## 2018-09-29 MED ORDER — MELATONIN 5 MG PO TABS
10.0000 mg | ORAL_TABLET | Freq: Every day | ORAL | Status: DC
Start: 2018-09-29 — End: 2018-10-01
  Administered 2018-09-29 – 2018-09-30 (×2): 10 mg via ORAL
  Filled 2018-09-29 (×2): qty 2

## 2018-09-29 NOTE — Progress Notes (Signed)
[facility] [place] NAME: Sandy Hickman    MR#:  [id number]  DATE OF BIRTH:  1934/05/28  SUBJECTIVE:   Patient's heart rates are stable.  Patient underwent a CT scan of the chest abdomen pelvis which was suggestive of supraclavicular and mediastinal lymphadenopathy with a large liver mass and also retroperitoneal lymphadenopathy.  Planning for ultrasound-guided biopsy of the liver mass today.  Patient's daughter is at bedside.  Denies any acute complaints presently.  REVIEW OF SYSTEMS:    Review of Systems  Constitutional: Positive for malaise/fatigue. Negative for chills and fever.  HENT: Negative for congestion and tinnitus.   Eyes: Negative for blurred vision and double vision.  Respiratory: Negative for cough, shortness of breath and wheezing.   Cardiovascular: Negative for chest pain, orthopnea and PND.  Gastrointestinal: Negative for abdominal pain, diarrhea, nausea and vomiting.  Genitourinary: Negative for dysuria and hematuria.  Musculoskeletal: Negative for back pain.  Neurological: Positive for weakness (generalized). Negative for dizziness, sensory change and focal weakness.  All other systems reviewed and are negative.   Nutrition: Heart healthy Tolerating Diet: Yes Tolerating PT: Await Eval.   DRUG ALLERGIES:   Allergies  Allergen Reactions  . Neosporin [Neomycin-Bacitracin Zn-Polymyx] Dermatitis  . Sulfa Antibiotics Itching and Rash  . Oxycodone-Acetaminophen Itching    VITALS:  Blood pressure (!) 109/57, pulse 84, temperature 98.5 F (36.9 C), temperature source Oral, resp. rate 18, height 5\' 5"  (1.651 m), weight 68.1 kg, SpO2 98 %.  PHYSICAL EXAMINATION:   Physical Exam  GENERAL:  84 y.o.-year-old pale appearing patient lying in bed in no acute distress.  EYES: Pupils equal, round, reactive to light and accommodation. No scleral icterus. Extraocular muscles intact.  HEENT: Head atraumatic, normocephalic.  Oropharynx and nasopharynx clear.  NECK:  Supple, no jugular venous distention. No thyroid enlargement, no tenderness.  LUNGS: Normal breath sounds bilaterally, no wheezing, rales, rhonchi. No use of accessory muscles of respiration.  CARDIOVASCULAR: S1, S2 normal. No murmurs, rubs, or gallops.  ABDOMEN: Soft, nontender, nondistended. Bowel sounds present. No organomegaly or mass.  EXTREMITIES: No cyanosis, clubbing or edema b/l.    NEUROLOGIC: Cranial nerves II through XII are intact. No focal Motor or sensory deficits b/l. Globally weak.   PSYCHIATRIC: The patient is alert and oriented x 3.  SKIN: No obvious rash, lesion, or ulcer.    LABORATORY PANEL:   CBC Recent Labs  Lab 09/29/18 0456  WBC 16.9*  HGB 12.2  HCT 39.1  PLT 156   ------------------------------------------------------------------------------------------------------------------  Chemistries  Recent Labs  Lab 09/27/18 1337  09/29/18 0013  NA 138   < > 136  K 4.6   < > 4.0  CL 104   < > 104  CO2 18*   < > 22  GLUCOSE 288*   < > 149*  BUN 47*   < > 40*  CREATININE 1.65*   < > 0.93  CALCIUM 12.9*   < > 11.5*  AST 47*  --   --   ALT 43  --   --   ALKPHOS 256*  --   --   BILITOT 1.3*  --   --    < > = values in this interval not displayed.   ------------------------------------------------------------------------------------------------------------------  Cardiac Enzymes Recent Labs  Lab 09/28/18 0952  TROPONINI 0.63*   ------------------------------------------------------------------------------------------------------------------  RADIOLOGY:  Dg Thoracic Spine 2 View  Result Date: 09/27/2018 CLINICAL DATA:  Weakness, back pain EXAM: THORACIC SPINE  2 VIEWS COMPARISON:  CT 05/04/2018 FINDINGS: Thoracic alignment is within normal limits. The vertebral body heights are maintained. Questionable erosions on the left side of the T6 and T7 vertebral bodies. IMPRESSION: 1. Questionable erosive change along  the left aspect of the T6 and T7 vertebral bodies. CT could be obtained for further evaluation. Electronically Signed   By: Donavan Foil M.D.   On: 09/27/2018 16:53   Dg Lumbar Spine 2-3 Views  Result Date: 09/27/2018 CLINICAL DATA:  Weakness with back pain EXAM: LUMBAR SPINE - 2-3 VIEW COMPARISON:  CT 08/10/2015 FINDINGS: Trace retrolisthesis L2 on L3 and L3 on L4. Moderate severe degenerative changes L3-L4, L1-L2 and L2-L3. Levoscoliosis of the lumbar spine. IMPRESSION: Scoliosis and marked multiple level degenerative change without acute osseous abnormality. Electronically Signed   By: Donavan Foil M.D.   On: 09/27/2018 17:12   Ct Chest W Contrast  Result Date: 09/28/2018 CLINICAL DATA:  Remote history of breast cancer. EXAM: CT CHEST, ABDOMEN, AND PELVIS WITH CONTRAST TECHNIQUE: Multidetector CT imaging of the chest, abdomen and pelvis was performed following the standard protocol during bolus administration of intravenous contrast. CONTRAST:  77mL OMNIPAQUE IOHEXOL 300 MG/ML  SOLN COMPARISON:  CT 05/04/2018 FINDINGS: CT CHEST FINDINGS Cardiovascular: No significant vascular findings. Normal heart size. No pericardial effusion. Mediastinum/Nodes: Post RIGHT mastectomy. No axillary adenopathy. Enlarged LEFT supraclavicular lymph node measures 14 mm diameter (image 4/2). Enlarged precarinal lymph node measures 12 mm short axis. Large subcarinal nodes measure up to 19 mm short axis. These nodes have developed in short interval and were not present on CT 05/04/2018 Lungs/Pleura: multiple small bilateral pulmonary nodules. There is interlobular septal thickening LEFT and RIGHT lung. The nodules are too numerous to count and new from comparison exam. Musculoskeletal: No aggressive osseous lesion. CT ABDOMEN AND PELVIS FINDINGS Hepatobiliary: \large irregular lesion in the posterior RIGHT hepatic lobe measures 7.3 by 5.9 cm is new from comparison exam. Bulky periportal adenopathy with lymph nodes measuring up  to 2.7 cm short axis. Pancreas: Pancreas is normal. No ductal dilatation. No pancreatic inflammation. Spleen: Normal spleen Adrenals/urinary tract: Adrenal glands and kidneys are normal. The ureters and bladder normal. Stomach/Bowel: Stomach, small bowel, appendix, and cecum are normal. The colon and rectosigmoid colon are normal. Vascular/Lymphatic: Abdominal aorta normal caliber. There is extensive upper abdominal retroperitoneal adenopathy and retrocrural adenopathy. Example lymph node posterior to the IVC at the level the renal veins measures 17 mm short axis. Reproductive: Post hysterectomy no adnexal abnormality Other: No peritoneal metastasis. Musculoskeletal: No aggressive osseous lesion degenerate change of the lower lumbar spine IMPRESSION: Chest Impression: 1. New extensive mediastinal and supraclavicular adenopathy consistent with metastatic disease. 2. New bilateral pulmonary nodules air and interlobular septal thickening suggest metastatic pulmonary nodules and lymphangitic spread of carcinoma. 3. Post RIGHT mastectomy anatomy. Abdomen / Pelvis Impression: 1. Large irregular lesion in the posterior RIGHT hepatic lobe consistent metastatic leison. 2. Bulky periportal, retroperitoneal and retrocrural lymphadenopathy in the abdomen. Electronically Signed   By: Suzy Bouchard M.D.   On: 09/28/2018 16:54   Mr Thoracic Spine Wo Contrast  Result Date: 09/27/2018 CLINICAL DATA:  Suspected bone destruction on prior radiograph EXAM: MRI THORACIC SPINE WITHOUT CONTRAST TECHNIQUE: Multiplanar, multisequence MR imaging of the thoracic spine was performed. No intravenous contrast was administered. COMPARISON:  Thoracic spine radiograph 09/27/2018 Chest CT 05/04/2018 FINDINGS: Alignment:  Normal Vertebrae: There is heterogeneous marrow signal throughout the thoracic spine. There are numerous low T1 weighted signal lesions with associated edema on the inversion  recovery images. The largest of these is at T7, but  most of the thoracic levels are involved. Cord:  Normal signal and morphology. Paraspinal and other soft tissues: There are numerous para-aortic retroperitoneal lymph nodes. Disc levels: No spinal canal stenosis. IMPRESSION: 1. Diffusely abnormal bone marrow signal throughout the thoracic spine, which may be secondary to lymphomatous infiltration of bone marrow. Postcontrast imaging may be helpful to better define the extent. 2. No pathologic fracture. 3. Numerous enlarged retroperitoneal lymph nodes. CT or PET CT is recommended to better characterize extent. Electronically Signed   By: Ulyses Jarred M.D.   On: 09/27/2018 23:58   Ct Abdomen Pelvis W Contrast  Result Date: 09/28/2018 CLINICAL DATA:  Number remote history of breast cancer EXAM: CT ABDOMEN AND PELVIS WITH CONTRAST TECHNIQUE: Multidetector CT imaging of the abdomen and pelvis was performed using the standard protocol following bolus administration of intravenous contrast. CONTRAST:  62mL OMNIPAQUE IOHEXOL 300 MG/ML  SOLN COMPARISON:  CT 05/04/2018 FINDINGS: CT CHEST FINDINGS Cardiovascular: No significant vascular findings. Normal heart size. No pericardial effusion. Mediastinum/Nodes: Post RIGHT mastectomy. No axillary adenopathy. Enlarged LEFT supraclavicular lymph node measures 14 mm diameter (image 4/2). Enlarged precarinal lymph node measures 12 mm short axis. Large subcarinal nodes measure up to 19 mm short axis. These nodes have developed in short interval and were not present on CT 05/04/2018 Lungs/Pleura: multiple small bilateral pulmonary nodules. There is interlobular septal thickening LEFT and RIGHT lung. The nodules are too numerous to count and new from comparison exam. Musculoskeletal: No aggressive osseous lesion. CT ABDOMEN AND PELVIS FINDINGS Hepatobiliary: \large irregular lesion in the posterior RIGHT hepatic lobe measures 7.3 by 5.9 cm is new from comparison exam. Bulky periportal adenopathy with lymph nodes measuring up to 2.7  cm short axis. Pancreas: Pancreas is normal. No ductal dilatation. No pancreatic inflammation. Spleen: Normal spleen Adrenals/urinary tract: Adrenal glands and kidneys are normal. The ureters and bladder normal. Stomach/Bowel: Stomach, small bowel, appendix, and cecum are normal. The colon and rectosigmoid colon are normal. Vascular/Lymphatic: Abdominal aorta normal caliber. There is extensive upper abdominal retroperitoneal adenopathy and retrocrural adenopathy. Example lymph node posterior to the IVC at the level the renal veins measures 17 mm short axis. Reproductive: Post hysterectomy no adnexal abnormality Other: No peritoneal metastasis. Musculoskeletal: No aggressive osseous lesion degenerate change of the lower lumbar spine IMPRESSION: Chest Impression: 1. New extensive mediastinal and supraclavicular adenopathy consistent with metastatic disease. 2. New bilateral pulmonary nodules air and interlobular septal thickening suggest metastatic pulmonary nodules and lymphangitic spread of carcinoma. 3. Post RIGHT mastectomy anatomy. Abdomen / Pelvis Impression: 1. Large irregular lesion in the posterior RIGHT hepatic lobe consistent metastatic leison. 2. Bulky periportal, retroperitoneal and retrocrural lymphadenopathy in the abdomen. Electronically Signed   By: Suzy Bouchard M.D.   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 Please perform ultrasound-guided biopsy of left supraclavicular lymph node for tissue diagnostic purposes. EXAM: ULTRASOUND-GUIDED BIOPSY LEFT SUPRACLAVICULAR LYMPH NODE COMPARISON:  CT the chest, abdomen and pelvis-10/08/2018 MEDICATIONS: None ANESTHESIA/SEDATION: Moderate (conscious) sedation was employed during this procedure. A total of Versed 1 mg and Fentanyl 50 mcg was administered intravenously. Moderate Sedation Time:  16 minutes. The patient's level of consciousness and vital signs were monitored continuously by radiology nursing throughout the procedure under my direct supervision. COMPLICATIONS: None immediate. TECHNIQUE: Informed written consent was obtained from the patient  after a discussion of the risks, benefits and alternatives to treatment. Questions regarding the procedure were encouraged and answered. Initial ultrasound scanning demonstrated enlarged at least 2.2 x 3.2 x 1.0 cm left supraclavicular lymph node correlating (images 2-10) with the dominant left supraclavicular lymph node seen on preceding chest CT (image 5, series 2). An ultrasound image was saved for documentation purposes. The procedure was planned. A timeout was performed prior to the initiation of the procedure. The operative was prepped and draped in the usual sterile fashion, and a sterile drape was applied covering the operative field. A timeout was performed prior to the initiation of the procedure. Local anesthesia was provided with 1% lidocaine with epinephrine. Under direct ultrasound guidance, an 18 gauge core needle device was utilized to obtain to obtain 7 core needle biopsies of the dominant indeterminate left supraclavicular lymph node. The samples were placed in saline and submitted to pathology. The needle was removed and hemostasis was achieved with manual compression. Post procedure scan was negative for significant hematoma. A dressing was placed. The patient tolerated the procedure well without immediate postprocedural complication. IMPRESSION: Technically successful ultrasound guided biopsy of dominant indeterminate left supraclavicular lymph node. Electronically Signed   By: Sandi Mariscal M.D.   On: 09/29/2018 13:43     ASSESSMENT AND PLAN:   84 yo female w/ hx of Breast Cancer, Marginal Zone Lymphoma, HTN, hx of A. Fib, hx of Neuropathy, Glaucoma, who presented to the hospital who presented to the hospital due to weakness and  falls and noted to be in a. Fib w/ RVR.    1.  Atrial fibrillation with rapid ventricular response- patient presented with elevated heart rates and much improved now and off Cardizem gtt.  - Continue Toprol.  - heparin drip on hold for biopsy but can resume now.   2.  Metastatic Cancer/unknown primary-patient has been having back pain with recurrent falls which has progressively gotten worse.  She had a MRI of the thoracic spine done which shows diffusely abnormal bone marrow through the entire thoracic spine suggestive of lymphomatous infiltration.  Patient has a previous history of marginal cell lymphoma. - Discussed with oncology Dr. Grayland Ormond who recommended getting a CT scan of the chest abdomen pelvis which showed mediastinal and supraclavicular lymphadenopathy along with a large liver mass and also retroperitoneal lymphadenopathy.  Plan is for ultrasound-guided liver biopsy today.  Await further care as per oncology.  Oncology has placed a palliative care consult for goals of care.  3.  Hypercalcemia- improving and continue IV fluids, got 1 dose of Zometa yesterday.  Will follow calcium level.  4.  Glaucoma-continue Latanoprost, Timolol eye drops.   5. GERD - cont. Protonix.   6. Neuropathy - cont. Neurontin.     All the records are reviewed and case discussed with Care Management/Social Worker. Management plans discussed with the patient, family and they are in agreement.  CODE STATUS: Full code  DVT Prophylaxis: Hep. gtt  TOTAL TIME TAKING CARE OF THIS PATIENT: 35 minutes.   POSSIBLE D/C IN 2-3 DAYS, DEPENDING ON CLINICAL CONDITION.   Henreitta Leber M.D on 09/29/2018 at 4:17 PM  Between 7am to 6pm - Pager - [phone]  After 6pm go to [email or web address] - [employer] [facility]  Office  [phone]  CC: Primary care physician; Ezequiel Kayser, MD

## 2018-09-29 NOTE — Consult Note (Signed)
ANTICOAGULATION CONSULT NOTE - Initial Consult  Pharmacy Consult for Heparin Indication: atrial fibrillation  Allergies  Allergen Reactions  . Neosporin [Neomycin-Bacitracin Zn-Polymyx] Dermatitis  . Sulfa Antibiotics Itching and Rash  . Oxycodone-Acetaminophen Itching    Patient Measurements: Height: 5\' 5"  (165.1 cm) Weight: 146 lb 11.2 oz (66.5 kg) IBW/kg (Calculated) : 57 Heparin Dosing Weight: 68   Vital Signs: Temp: 97.6 F (36.4 C) (11/06 0517) Temp Source: Oral (11/06 0517) BP: 101/56 (11/06 0517) Pulse Rate: 58 (11/06 0517)  Labs: Recent Labs    09/27/18 1337  09/27/18 1622 09/27/18 2129 09/28/18 0356 09/28/18 0605 09/28/18 0952 09/28/18 1433 09/29/18 0013 09/29/18 0456  HGB 13.9  --   --   --  11.7*  --   --   --   --  12.2  HCT 44.9  --   --   --  36.4  --   --   --   --  39.1  PLT 177  --   --   --  149*  --   --   --   --  156  APTT  --    < > <24*  --   --  77*  --  64* 85* 107*  LABPROT  --   --  15.2  --   --   --   --   --   --   --   INR  --   --  1.21  --   --   --   --   --   --   --   HEPARINUNFRC  --   --  1.74*  --   --  2.00*  --   --   --   --   CREATININE 1.65*  --   --   --  1.23*  --   --   --  0.93  --   TROPONINI 0.32*  --   --  0.82* 0.81*  --  0.63*  --   --   --    < > = values in this interval not displayed.    Estimated Creatinine Clearance: 42 mL/min (by C-G formula based on SCr of 0.93 mg/dL).   Medical History: Past Medical History:  Diagnosis Date  . Allergy   . Arrhythmia   . Arthritis   . Atrial fibrillation ([place])   . Breast cancer ([place]) 2006   rt mastectomy  . Chronic kidney disease    chronic uti, kidney stones  . GERD (gastroesophageal reflux disease)   . Glaucoma   . HLD (hyperlipidemia)   . Hypertension   . Marginal zone lymphoma ([place])    left orbit  . Personal history of chemotherapy   . Personal history of radiation therapy   . 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  (last dose 11/4 @ 1000) HL elevated at baseline  Choosing to not bolus per protocol and conversation with admitting MD       Goal of Therapy:  Heparin level 0.3-0.7 units/ml Monitor platelets by anticoagulation protocol: Yes   Plan:  11/06 @ 0500 aPTT 107 seconds slightly supratherapeutic. Will decrease rate down to 1050 units/hr and will recheck aPTT @ 1300 and HL w/ am labs. CBC stable.  Tobie Lords, PharmD Clinical Pharmacist 09/29/2018 5:47 AM

## 2018-09-29 NOTE — Progress Notes (Signed)
PT Cancellation Note  Patient Details Name: Sandy Hickman MRN: [id number] DOB: 1934/07/22   Cancelled Treatment:    Reason Eval/Treat Not Completed: Other (comment)(PT attempted x2 this AM, first attempt deferred for nursing care. 2nd attempt PT spoke with nursing staff, expecting transport very shortly for imaging. PT will follow up as able.)   Lieutenant Diego PT, DPT 11:39 AM,09/29/18 [phone]

## 2018-09-29 NOTE — Progress Notes (Signed)
Heparin has been stopped for liver Biopsy per [facility]

## 2018-09-29 NOTE — Consult Note (Signed)
ANTICOAGULATION CONSULT NOTE - Initial Consult  Pharmacy Consult for Heparin Indication: atrial fibrillation  Allergies  Allergen Reactions  . Neosporin [Neomycin-Bacitracin Zn-Polymyx] Dermatitis  . Sulfa Antibiotics Itching and Rash  . Oxycodone-Acetaminophen Itching    Patient Measurements: Height: 5\' 5"  (165.1 cm) Weight: 146 lb 11.2 oz (66.5 kg) IBW/kg (Calculated) : 57 Heparin Dosing Weight: 68   Vital Signs: Temp: 98 F (36.7 C) (11/05 2004) Temp Source: Oral (11/05 2004) BP: 100/59 (11/05 2004) Pulse Rate: 92 (11/05 2004)  Labs: Recent Labs    09/27/18 1337  09/27/18 1622 09/27/18 2129 09/28/18 0356 09/28/18 0605 09/28/18 0952 09/28/18 1433 09/29/18 0013  HGB 13.9  --   --   --  11.7*  --   --   --   --   HCT 44.9  --   --   --  36.4  --   --   --   --   PLT 177  --   --   --  149*  --   --   --   --   APTT  --    < > <24*  --   --  77*  --  64* 85*  LABPROT  --   --  15.2  --   --   --   --   --   --   INR  --   --  1.21  --   --   --   --   --   --   HEPARINUNFRC  --   --  1.74*  --   --  2.00*  --   --   --   CREATININE 1.65*  --   --   --  1.23*  --   --   --  0.93  TROPONINI 0.32*  --   --  0.82* 0.81*  --  0.63*  --   --    < > = values in this interval not displayed.    Estimated Creatinine Clearance: 42 mL/min (by C-G formula based on SCr of 0.93 mg/dL).   Medical History: Past Medical History:  Diagnosis Date  . Allergy   . Arrhythmia   . Arthritis   . Atrial fibrillation ([place])   . Breast cancer ([place]) 2006   rt mastectomy  . Chronic kidney disease    chronic uti, kidney stones  . GERD (gastroesophageal reflux disease)   . Glaucoma   . HLD (hyperlipidemia)   . Hypertension   . Marginal zone lymphoma ([place])    left orbit  . Personal history of chemotherapy   . Personal history of radiation therapy   . 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  (last dose 11/4 @ 1000) HL elevated at baseline  Choosing to not bolus per protocol and conversation with admitting MD       Goal of Therapy:  Heparin level 0.3-0.7 units/ml Monitor platelets by anticoagulation protocol: Yes   Plan:  11/06 @ 0000 aPTT 85 seconds therapeutic. Will continue current rate and will recheck aPTT/HL/CBC w/ am labs.  Tobie Lords, PharmD Clinical Pharmacist 09/29/2018 12:53 AM

## 2018-09-29 NOTE — Consult Note (Addendum)
ANTICOAGULATION CONSULT NOTE - Initial Consult  Pharmacy Consult for Heparin Indication: atrial fibrillation  Allergies  Allergen Reactions  . Neosporin [Neomycin-Bacitracin Zn-Polymyx] Dermatitis  . Sulfa Antibiotics Itching and Rash  . Oxycodone-Acetaminophen Itching    Patient Measurements: Height: 5\' 5"  (165.1 cm) Weight: 150 lb 1.6 oz (68.1 kg) IBW/kg (Calculated) : 57 Heparin Dosing Weight: 68   Vital Signs: Temp: 98.5 F (36.9 C) (11/06 0806) Temp Source: Oral (11/06 0806) BP: 83/53 (11/06 1356) Pulse Rate: 84 (11/06 1356)  Labs: Recent Labs    09/27/18 1337  09/27/18 1622 09/27/18 2129 09/28/18 0356 09/28/18 0605 09/28/18 0952  09/29/18 0013 09/29/18 0456 09/29/18 0811 09/29/18 1341  HGB 13.9  --   --   --  11.7*  --   --   --   --  12.2  --   --   HCT 44.9  --   --   --  36.4  --   --   --   --  39.1  --   --   PLT 177  --   --   --  149*  --   --   --   --  156  --   --   APTT  --    < > <24*  --   --  77*  --    < > 85* 107*  --  26  LABPROT  --   --  15.2  --   --   --   --   --   --   --  14.1  --   INR  --   --  1.21  --   --   --   --   --   --   --  1.10  --   HEPARINUNFRC  --   --  1.74*  --   --  2.00*  --   --   --  1.54*  --   --   CREATININE 1.65*  --   --   --  1.23*  --   --   --  0.93  --   --   --   TROPONINI 0.32*  --   --  0.82* 0.81*  --  0.63*  --   --   --   --   --    < > = values in this interval not displayed.    Estimated Creatinine Clearance: 42 mL/min (by C-G formula based on SCr of 0.93 mg/dL).   Medical History: Past Medical History:  Diagnosis Date  . Allergy   . Arrhythmia   . Arthritis   . Atrial fibrillation ([place])   . Breast cancer ([place]) 2006   rt mastectomy  . Chronic kidney disease    chronic uti, kidney stones  . GERD (gastroesophageal reflux disease)   . Glaucoma   . HLD (hyperlipidemia)   . Hypertension   . Marginal zone lymphoma ([place])    left orbit  . Personal history of chemotherapy   . Personal  history of radiation therapy   . 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  (last dose 11/4 @ 1000) HL elevated at baseline  Heparin being resumes post liver biposy     Goal of Therapy:  Heparin level 0.3-0.7 units/ml Monitor platelets by anticoagulation protocol: Yes   Plan:  Resume heparin at previous rate  of 1050 units/hr and recheck level in 8 hours. Heparin level/ CBC in the AM for correlation. Will resume at 1900 (6hr post biopsy) per protocol   Ramond Dial, PharmD Clinical Pharmacist 09/29/2018 2:02 PM

## 2018-09-29 NOTE — Consult Note (Signed)
[facility]  Telephone:([phone] [phone]   Name: Sandy Hickman Date: 09/29/2018 MRN: [id number]  DOB: 04-15-1934  Patient Care Team: Ezequiel Kayser, MD as PCP - General (Internal Medicine)    REASON FOR CONSULTATION: Palliative Care consult requested for this 84 y.o. female with multiple medical problems including marginal zone lymphoma status post radiation, A. fib on Eliquis, history of breast cancer status post right mastectomy, and CKD, who was admitted on 09/27/2018 with progressive weakness over the past few weeks.  She was found to have A. fib with RVR, acute CHF, and hypercalcemia.  CT of chest/abdomen/pelvis reveal extensive new mediastinal and supraclavicular adenopathy, bilateral pulmonary nodules with probable lymphangitic spread, a large hepatic mass, and bulky retroperitoneal lymphadenopathy suggestive of widely metastatic disease.  Of note, this disease was not evident at the time of last CT on 05/04/2018.  Patient is status post biopsy of the left supraclavicular lymph node.  Pathology is pending.  Palliative care was asked to help establish goals of care.   SOCIAL HISTORY:    Patient is not married.  She lives at home alone.  She has a daughter who lives nearby.  Patient has a son in [place], [place].  ADVANCE DIRECTIVES:  Daughter is her decision-maker.  CODE STATUS: DNR  PAST MEDICAL HISTORY: Past Medical History:  Diagnosis Date  . Allergy   . Arrhythmia   . Arthritis   . Atrial fibrillation ([place])   . Breast cancer ([place]) 2006   rt mastectomy  . Chronic kidney disease    chronic uti, kidney stones  . GERD (gastroesophageal reflux disease)   . Glaucoma   . HLD (hyperlipidemia)   . Hypertension   . Marginal zone lymphoma ([place])    left orbit  . Personal history of chemotherapy   . Personal history of radiation therapy   . 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 ABDOMINAL HYSTERECTOMY    . BLADDER SUSPENSION     15 years ago  . BREAST SURGERY    . cataract surgery Bilateral   . CHOLECYSTECTOMY    . JOINT REPLACEMENT    . KNEE SURGERY Right    2016  . MASTECTOMY Right 2006   rt mast /rad/chemo  . ROTATOR CUFF REPAIR     13 years    HEMATOLOGY/ONCOLOGY HISTORY:   No history exists.    ALLERGIES:  is allergic to neosporin [neomycin-bacitracin zn-polymyx]; sulfa antibiotics; and oxycodone-acetaminophen.  MEDICATIONS:  Current Facility-Administered Medications  Medication Dose Route Frequency Provider Last Rate Last Dose  . 0.9 %  sodium chloride infusion   Intravenous Continuous Henreitta Leber, MD 50 mL/hr at 09/29/18 1525    . acetaminophen (TYLENOL) tablet 650 mg  650 mg Oral Q6H PRN Gladstone Lighter, MD       Or  . acetaminophen (TYLENOL) suppository 650 mg  650 mg Rectal Q6H PRN Gladstone Lighter, MD      . acidophilus (RISAQUAD) capsule 1 capsule  1 capsule Oral Daily Gladstone Lighter, MD   1 capsule at 09/29/18 0817  . feeding supplement (ENSURE ENLIVE) (ENSURE ENLIVE) liquid 237 mL  237 mL Oral TID BM Henreitta Leber, MD   237 mL at 09/28/18 1116  . fentaNYL (SUBLIMAZE) 100 MCG/2ML injection           . ferrous sulfate tablet 325 mg  325 mg Oral Q breakfast Gladstone Lighter, MD   325 mg at 09/29/18 0816  . gabapentin (NEURONTIN) capsule 300 mg  300 mg Oral QHS Gladstone Lighter, MD   300 mg at 09/28/18 2131  . heparin ADULT infusion 100 units/mL (25000 units/231m sodium chloride 0.45%)  1,050 Units/hr Intravenous Continuous Maccia, Melissa D, RPH      . latanoprost (XALATAN) 0.005 % ophthalmic solution 1 drop  1 drop Both Eyes QHS KGladstone Lighter MD   1 drop at 09/28/18 2131  . loratadine (CLARITIN) tablet 10 mg  10 mg Oral Daily PRN KGladstone Lighter MD      . Melatonin TABS 10 mg  10 mg Oral QHS SHenreitta Leber MD      . metoprolol succinate  (TOPROL-XL) 24 hr tablet 25 mg  25 mg Oral Daily KGladstone Lighter MD   25 mg at 09/29/18 0817  . midazolam (VERSED) 5 MG/5ML injection           . mirtazapine (REMERON) tablet 15 mg  15 mg Oral QHS KGladstone Lighter MD   15 mg at 09/28/18 2131  . multivitamin with minerals tablet 1 tablet  1 tablet Oral Daily KGladstone Lighter MD   1 tablet at 09/29/18 0817  . omega-3 acid ethyl esters (LOVAZA) capsule 1 g  1 g Oral Daily KGladstone Lighter MD   1 g at 09/29/18 0817  . ondansetron (ZOFRAN) tablet 4 mg  4 mg Oral Q6H PRN KGladstone Lighter MD       Or  . ondansetron (ZOFRAN) injection 4 mg  4 mg Intravenous Q6H PRN KGladstone Lighter MD      . pantoprazole (PROTONIX) EC tablet 40 mg  40 mg Oral Daily KGladstone Lighter MD   40 mg at 09/29/18 0816  . timolol (TIMOPTIC) 0.5 % ophthalmic solution 1 drop  1 drop Both Eyes Daily KGladstone Lighter MD   1 drop at 09/29/18 0818  . traMADol (ULTRAM) tablet 50 mg  50 mg Oral Q6H PRN KGladstone Lighter MD   50 mg at 09/28/18 0941  . vitamin B-12 (CYANOCOBALAMIN) tablet 500 mcg  500 mcg Oral Daily KGladstone Lighter MD   500 mcg at 09/29/18 0817  . vitamin C (ASCORBIC ACID) tablet 500 mg  500 mg Oral Daily KGladstone Lighter MD   500 mg at 09/29/18 0817  . zolpidem (AMBIEN) tablet 5 mg  5 mg Oral QHS PRN WLance Coon MD   5 mg at 09/28/18 2131    VITAL SIGNS: BP (!) 109/57 (BP Location: Left Arm)   Pulse 84   Temp 98.5 F (36.9 C) (Oral)   Resp 18   Ht '5\' 5"'  (1.651 m)   Wt 150 lb 1.6 oz (68.1 kg)   SpO2 98%   BMI 24.98 kg/m  Filed Weights   09/28/18 0420 09/29/18 0500 09/29/18 0517  Weight: 146 lb 11.2 oz (66.5 kg) 150 lb 2.1 oz (68.1 kg) 150 lb 1.6 oz (68.1 kg)    Estimated body mass index is 24.98 kg/m as calculated from the following:   Height as of this encounter: '5\' 5"'  (1.651 m).   Weight as of this encounter: 150 lb 1.6 oz (68.1 kg).  LABS: CBC:    Component Value Date/Time   WBC 16.9 (H) 09/29/2018 0456   HGB 12.2  09/29/2018 0456   HGB 9.4 (L) 07/04/2013 0445   HCT 39.1 09/29/2018 0456   HCT 27.5 (L) 07/04/2013 0445   PLT 156 09/29/2018 0456   PLT 277  07/04/2013 0445   MCV 95.6 09/29/2018 0456   MCV 89 07/04/2013 0445   NEUTROABS 18.4 (H) 09/27/2018 1337   NEUTROABS 9.5 (H) 07/04/2013 0445   LYMPHSABS 1.8 09/27/2018 1337   LYMPHSABS 2.4 07/04/2013 0445   MONOABS 1.5 (H) 09/27/2018 1337   MONOABS 1.0 (H) 07/04/2013 0445   EOSABS 0.9 (H) 09/27/2018 1337   EOSABS 0.0 07/04/2013 0445   BASOSABS 0.1 09/27/2018 1337   BASOSABS 0.0 07/04/2013 0445   Comprehensive Metabolic Panel:    Component Value Date/Time   NA 136 09/29/2018 0013   NA 136 07/04/2013 0445   K 4.0 09/29/2018 0013   K 5.1 07/04/2013 0445   CL 104 09/29/2018 0013   CL 106 07/04/2013 0445   CO2 22 09/29/2018 0013   CO2 24 07/04/2013 0445   BUN 40 (H) 09/29/2018 0013   BUN 17 07/04/2013 0445   CREATININE 0.93 09/29/2018 0013   CREATININE 0.95 07/04/2013 0445   GLUCOSE 149 (H) 09/29/2018 0013   GLUCOSE 125 (H) 07/04/2013 0445   CALCIUM 11.5 (H) 09/29/2018 0013   CALCIUM 8.7 07/04/2013 0445   AST 47 (H) 09/27/2018 1337   AST 30 07/02/2013 1355   ALT 43 09/27/2018 1337   ALT 30 07/02/2013 1355   ALKPHOS 256 (H) 09/27/2018 1337   ALKPHOS 105 07/02/2013 1355   BILITOT 1.3 (H) 09/27/2018 1337   BILITOT 0.5 07/02/2013 1355   PROT 6.8 09/27/2018 1337   PROT 8.1 07/02/2013 1355   ALBUMIN 2.9 (L) 09/27/2018 1337   ALBUMIN 3.5 07/02/2013 1355    RADIOGRAPHIC STUDIES: Dg Thoracic Spine 2 View  Result Date: 09/27/2018 CLINICAL DATA:  Weakness, back pain EXAM: THORACIC SPINE 2 VIEWS COMPARISON:  CT 05/04/2018 FINDINGS: Thoracic alignment is within normal limits. The vertebral body heights are maintained. Questionable erosions on the left side of the T6 and T7 vertebral bodies. IMPRESSION: 1. Questionable erosive change along the left aspect of the T6 and T7 vertebral bodies. CT could be obtained for further evaluation.  Electronically Signed   By: Donavan Foil M.D.   On: 09/27/2018 16:53   Dg Lumbar Spine 2-3 Views  Result Date: 09/27/2018 CLINICAL DATA:  Weakness with back pain EXAM: LUMBAR SPINE - 2-3 VIEW COMPARISON:  CT 08/10/2015 FINDINGS: Trace retrolisthesis L2 on L3 and L3 on L4. Moderate severe degenerative changes L3-L4, L1-L2 and L2-L3. Levoscoliosis of the lumbar spine. IMPRESSION: Scoliosis and marked multiple level degenerative change without acute osseous abnormality. Electronically Signed   By: Donavan Foil M.D.   On: 09/27/2018 17:12   Ct Head Wo Contrast  Result Date: 09/27/2018 CLINICAL DATA:  84 y/o  F; fall on Eliquis. Generalized weakness. EXAM: CT HEAD WITHOUT CONTRAST TECHNIQUE: Contiguous axial images were obtained from the base of the skull through the vertex without intravenous contrast. COMPARISON:  05/04/2018 MRI of the head. FINDINGS: Brain: No evidence of acute infarction, hemorrhage, hydrocephalus, extra-axial collection or mass lesion/mass effect of the brain parenchyma. Stable right lateral frontal meningioma measuring 16 mm (series 4, image 22). Few stable nonspecific white matter hypodensities compatible with mild chronic microvascular ischemic changes. Stable mild volume loss of the brain. Vascular: Mild calcific atherosclerosis of the carotid siphons. Skull: Normal. Negative for fracture or focal lesion. Sinuses/Orbits: No acute finding. Other: Borderline high-riding jugular bulb. IMPRESSION: 1. No acute intracranial abnormality. 2. Stable mild chronic microvascular ischemic changes and mild volume loss of the brain. 3. Stable right lateral frontal region meningioma. Electronically Signed   By: Edgardo Roys.D.  On: 09/27/2018 13:57   Ct Chest W Contrast  Result Date: 09/28/2018 CLINICAL DATA:  Remote history of breast cancer. EXAM: CT CHEST, ABDOMEN, AND PELVIS WITH CONTRAST TECHNIQUE: Multidetector CT imaging of the chest, abdomen and pelvis was performed following  the standard protocol during bolus administration of intravenous contrast. CONTRAST:  67m OMNIPAQUE IOHEXOL 300 MG/ML  SOLN COMPARISON:  CT 05/04/2018 FINDINGS: CT CHEST FINDINGS Cardiovascular: No significant vascular findings. Normal heart size. No pericardial effusion. Mediastinum/Nodes: Post RIGHT mastectomy. No axillary adenopathy. Enlarged LEFT supraclavicular lymph node measures 14 mm diameter (image 4/2). Enlarged precarinal lymph node measures 12 mm short axis. Large subcarinal nodes measure up to 19 mm short axis. These nodes have developed in short interval and were not present on CT 05/04/2018 Lungs/Pleura: multiple small bilateral pulmonary nodules. There is interlobular septal thickening LEFT and RIGHT lung. The nodules are too numerous to count and new from comparison exam. Musculoskeletal: No aggressive osseous lesion. CT ABDOMEN AND PELVIS FINDINGS Hepatobiliary: \large irregular lesion in the posterior RIGHT hepatic lobe measures 7.3 by 5.9 cm is new from comparison exam. Bulky periportal adenopathy with lymph nodes measuring up to 2.7 cm short axis. Pancreas: Pancreas is normal. No ductal dilatation. No pancreatic inflammation. Spleen: Normal spleen Adrenals/urinary tract: Adrenal glands and kidneys are normal. The ureters and bladder normal. Stomach/Bowel: Stomach, small bowel, appendix, and cecum are normal. The colon and rectosigmoid colon are normal. Vascular/Lymphatic: Abdominal aorta normal caliber. There is extensive upper abdominal retroperitoneal adenopathy and retrocrural adenopathy. Example lymph node posterior to the IVC at the level the renal veins measures 17 mm short axis. Reproductive: Post hysterectomy no adnexal abnormality Other: No peritoneal metastasis. Musculoskeletal: No aggressive osseous lesion degenerate change of the lower lumbar spine IMPRESSION: Chest Impression: 1. New extensive mediastinal and supraclavicular adenopathy consistent with metastatic disease. 2. New  bilateral pulmonary nodules air and interlobular septal thickening suggest metastatic pulmonary nodules and lymphangitic spread of carcinoma. 3. Post RIGHT mastectomy anatomy. Abdomen / Pelvis Impression: 1. Large irregular lesion in the posterior RIGHT hepatic lobe consistent metastatic leison. 2. Bulky periportal, retroperitoneal and retrocrural lymphadenopathy in the abdomen. Electronically Signed   By: SSuzy BouchardM.D.   On: 09/28/2018 16:54   Mr Thoracic Spine Wo Contrast  Result Date: 09/27/2018 CLINICAL DATA:  Suspected bone destruction on prior radiograph EXAM: MRI THORACIC SPINE WITHOUT CONTRAST TECHNIQUE: Multiplanar, multisequence MR imaging of the thoracic spine was performed. No intravenous contrast was administered. COMPARISON:  Thoracic spine radiograph 09/27/2018 Chest CT 05/04/2018 FINDINGS: Alignment:  Normal Vertebrae: There is heterogeneous marrow signal throughout the thoracic spine. There are numerous low T1 weighted signal lesions with associated edema on the inversion recovery images. The largest of these is at T7, but most of the thoracic levels are involved. Cord:  Normal signal and morphology. Paraspinal and other soft tissues: There are numerous para-aortic retroperitoneal lymph nodes. Disc levels: No spinal canal stenosis. IMPRESSION: 1. Diffusely abnormal bone marrow signal throughout the thoracic spine, which may be secondary to lymphomatous infiltration of bone marrow. Postcontrast imaging may be helpful to better define the extent. 2. No pathologic fracture. 3. Numerous enlarged retroperitoneal lymph nodes. CT or PET CT is recommended to better characterize extent. Electronically Signed   By: KUlyses JarredM.D.   On: 09/27/2018 23:58   Ct Abdomen Pelvis W Contrast  Result Date: 09/28/2018 CLINICAL DATA:  Number remote history of breast cancer EXAM: CT ABDOMEN AND PELVIS WITH CONTRAST TECHNIQUE: Multidetector CT imaging of the abdomen and pelvis was  performed using the  standard protocol following bolus administration of intravenous contrast. CONTRAST:  58m OMNIPAQUE IOHEXOL 300 MG/ML  SOLN COMPARISON:  CT 05/04/2018 FINDINGS: CT CHEST FINDINGS Cardiovascular: No significant vascular findings. Normal heart size. No pericardial effusion. Mediastinum/Nodes: Post RIGHT mastectomy. No axillary adenopathy. Enlarged LEFT supraclavicular lymph node measures 14 mm diameter (image 4/2). Enlarged precarinal lymph node measures 12 mm short axis. Large subcarinal nodes measure up to 19 mm short axis. These nodes have developed in short interval and were not present on CT 05/04/2018 Lungs/Pleura: multiple small bilateral pulmonary nodules. There is interlobular septal thickening LEFT and RIGHT lung. The nodules are too numerous to count and new from comparison exam. Musculoskeletal: No aggressive osseous lesion. CT ABDOMEN AND PELVIS FINDINGS Hepatobiliary: \large irregular lesion in the posterior RIGHT hepatic lobe measures 7.3 by 5.9 cm is new from comparison exam. Bulky periportal adenopathy with lymph nodes measuring up to 2.7 cm short axis. Pancreas: Pancreas is normal. No ductal dilatation. No pancreatic inflammation. Spleen: Normal spleen Adrenals/urinary tract: Adrenal glands and kidneys are normal. The ureters and bladder normal. Stomach/Bowel: Stomach, small bowel, appendix, and cecum are normal. The colon and rectosigmoid colon are normal. Vascular/Lymphatic: Abdominal aorta normal caliber. There is extensive upper abdominal retroperitoneal adenopathy and retrocrural adenopathy. Example lymph node posterior to the IVC at the level the renal veins measures 17 mm short axis. Reproductive: Post hysterectomy no adnexal abnormality Other: No peritoneal metastasis. Musculoskeletal: No aggressive osseous lesion degenerate change of the lower lumbar spine IMPRESSION: Chest Impression: 1. New extensive mediastinal and supraclavicular adenopathy consistent with metastatic disease. 2. New  bilateral pulmonary nodules air and interlobular septal thickening suggest metastatic pulmonary nodules and lymphangitic spread of carcinoma. 3. Post RIGHT mastectomy anatomy. Abdomen / Pelvis Impression: 1. Large irregular lesion in the posterior RIGHT hepatic lobe consistent metastatic leison. 2. Bulky periportal, retroperitoneal and retrocrural lymphadenopathy in the abdomen. Electronically Signed   By: SSuzy BouchardM.D.   On: 09/28/2018 17:02   Dg Chest Portable 1 View  Result Date: 09/27/2018 CLINICAL DATA:  Weakness and fall.  Tachycardia. EXAM: PORTABLE CHEST 1 VIEW COMPARISON:  07/02/2013 radiographs. 09/15/2018 chest radiograph report, but images are not available to view. FINDINGS: The cardiomediastinal silhouette is unremarkable. Interstitial prominence bilaterally noted. Elevation of the RIGHT hemidiaphragm and surgical clips in the RIGHT axillary region again identified. No pleural effusion, pneumothorax or definite airspace disease. No acute bony abnormalities are identified. IMPRESSION: Mild interstitial prominence of uncertain chronicity. Infection or mild edema are considerations if acute. No other significant abnormalities. Electronically Signed   By: JMargarette CanadaM.D.   On: 09/27/2018 14:01   [place] Biopsy (lymph Nodes)  Result Date: 09/29/2018 INDICATION: History of marginal zone lymphoma involving the left orbit, now with findings worrisome for lymphomatous recurrence. Please perform ultrasound-guided biopsy of left supraclavicular lymph node for tissue diagnostic purposes. EXAM: ULTRASOUND-GUIDED BIOPSY LEFT SUPRACLAVICULAR LYMPH NODE COMPARISON:  CT the chest, abdomen and pelvis-10/08/2018 MEDICATIONS: None ANESTHESIA/SEDATION: Moderate (conscious) sedation was employed during this procedure. A total of Versed 1 mg and Fentanyl 50 mcg was administered intravenously. Moderate Sedation Time: 16 minutes. The patient's level of consciousness and vital signs were monitored continuously  by radiology nursing throughout the procedure under my direct supervision. COMPLICATIONS: None immediate. TECHNIQUE: Informed written consent was obtained from the patient after a discussion of the risks, benefits and alternatives to treatment. Questions regarding the procedure were encouraged and answered. Initial ultrasound scanning demonstrated enlarged at least 2.2 x 3.2 x 1.0 cm  left supraclavicular lymph node correlating (images 2-10) with the dominant left supraclavicular lymph node seen on preceding chest CT (image 5, series 2). An ultrasound image was saved for documentation purposes. The procedure was planned. A timeout was performed prior to the initiation of the procedure. The operative was prepped and draped in the usual sterile fashion, and a sterile drape was applied covering the operative field. A timeout was performed prior to the initiation of the procedure. Local anesthesia was provided with 1% lidocaine with epinephrine. Under direct ultrasound guidance, an 18 gauge core needle device was utilized to obtain to obtain 7 core needle biopsies of the dominant indeterminate left supraclavicular lymph node. The samples were placed in saline and submitted to pathology. The needle was removed and hemostasis was achieved with manual compression. Post procedure scan was negative for significant hematoma. A dressing was placed. The patient tolerated the procedure well without immediate postprocedural complication. IMPRESSION: Technically successful ultrasound guided biopsy of dominant indeterminate left supraclavicular lymph node. Electronically Signed   By: Sandi Mariscal M.D.   On: 09/29/2018 13:43    PERFORMANCE STATUS (ECOG) : 2 - Symptomatic, <50% confined to bed  Review of Systems As noted above. Otherwise, a complete review of systems is negative.  Physical Exam General: frail appearing, thin Cardiovascular: regular rate and rhythm Pulmonary: clear ant fields Abdomen: soft, nontender, +  bowel sounds GU: no suprapubic tenderness Extremities: no edema, no joint deformities  Skin: no rashes Neurological: Weakness, oriented but with some delay in answering questions  IMPRESSION: I met with patient to discuss goals.  She answers orientation questions appropriately but has some delay in responses.  Overall, her mentation is apparently much improved from yesterday.  Patient tells me that she knows she has cancer and it is "all over".  Patient suggests that she would likely pursue treatment in order to extend her life.  However, we will have more extensive conversations about treatment and goals once the pathology returns and oncology has had a chance to speak with her.  I discussed CODE STATUS with patient.  She says "I would want to drag things out".  She clarifies that she would not want to be resuscitated.  However, patient tells me that I can call her daughter to discuss that decision.  I called and spoke with patient's daughter.  She also was aware the patient appears to have widely metastatic cancer.  Daughter says that she would be surprised if patient sought treatment and felt she would likely opt for less aggressive care.  Again, we agreed that we would talk more in the coming days once the options are clear.  I discussed CODE STATUS with daughter.  She verbalized clearly that she would not want patient to be resuscitated nor have her life prolonged on machines.  She was in agreement with DNR.  PLAN: Continue medical treatment Path pending with future treatment options per oncology DNR Will follow   Time Total: 60 minutes  Visit consisted of counseling and education dealing with the complex and emotionally intense issues of symptom management and palliative care in the setting of serious and potentially life-threatening illness.Greater than 50%  of this time was spent counseling and coordinating care related to the above assessment and plan.  Signed by: Altha Harm,  PhD, DNP, NP-C, [facility] [phone] (Work Cell)

## 2018-09-29 NOTE — Procedures (Signed)
Pre Procedure Dx: Cervical Lymphadenopathy Post Procedural Dx: Same  Technically successful US guided biopsy of indeterminate left supraclavicular lymph node.   EBL: None  No immediate complications.   Ronny Bacon, MD Pager #: [phone]

## 2018-09-29 NOTE — Plan of Care (Signed)
  Problem: Education: Goal: Knowledge of General Education information will improve Description: Including pain rating scale, medication(s)/side effects and non-pharmacologic comfort measures Outcome: Progressing   Problem: Skin Integrity: Goal: Risk for impaired skin integrity will decrease Outcome: Progressing   

## 2018-09-29 NOTE — Progress Notes (Signed)
[facility] Cardiology [facility] Encounter Note  Patient: Sandy Hickman / Admit Date: 09/27/2018 / Date of Encounter: 09/29/2018, 8:23 AM   Subjective: Patient continues to be weak fatigued and lethargic.  No improvements at this time.  Liver mass has been seen and will be evaluated in the near future for possible causes of current symptoms.  Atrial fibrillation with controlled ventricular rate with metoprolol.  No evidence of bleeding at this time  Review of Systems: Positive for: Shortness of breath and fatigue Negative for: Vision change, hearing change, syncope, dizziness, nausea, vomiting,diarrhea, bloody stool, stomach pain, cough, congestion, diaphoresis, urinary frequency, urinary pain,skin lesions, skin rashes Others previously listed  Objective: Telemetry: Atrial fibrillation with controlled ventricular rate Physical Exam: Blood pressure (!) 110/58, pulse (!) 126, temperature 98.5 F (36.9 C), temperature source Oral, resp. rate 17, height 5\' 5"  (1.651 m), weight 68.1 kg, SpO2 95 %. Body mass index is 24.98 kg/m. General: Well developed, well nourished, in no acute distress. Head: Normocephalic, atraumatic, sclera non-icteric, no xanthomas, nares are without discharge. Neck: No apparent masses Lungs: Normal respirations with no wheezes, no rhonchi, no rales , no crackles   Heart: Irregular rate and rhythm, normal S1 S2, no murmur, no rub, no gallop, PMI is normal size and placement, carotid upstroke normal without bruit, jugular venous pressure normal Abdomen: Soft, non-tender, non-distended with normoactive bowel sounds. No hepatosplenomegaly. Abdominal aorta is normal size without bruit Extremities: Trace edema, no clubbing, no cyanosis, no ulcers,  Peripheral: 2+ radial, 2+ femoral, 2+ dorsal pedal pulses Neuro: Alert and oriented. Moves all extremities spontaneously. Psych:  Responds to questions appropriately with a normal affect.   Intake/Output Summary (Last 24  hours) at 09/29/2018 0823 Last data filed at 09/29/2018 0400 Gross per 24 hour  Intake 1432.78 ml  Output 650 ml  Net 782.78 ml    Inpatient Medications:  . acidophilus  1 capsule Oral Daily  . feeding supplement (ENSURE ENLIVE)  237 mL Oral TID BM  . ferrous sulfate  325 mg Oral Q breakfast  . gabapentin  300 mg Oral QHS  . latanoprost  1 drop Both Eyes QHS  . Melatonin   Oral QHS  . metoprolol succinate  25 mg Oral Daily  . mirtazapine  15 mg Oral QHS  . multivitamin with minerals  1 tablet Oral Daily  . omega-3 acid ethyl esters  1 g Oral Daily  . pantoprazole  40 mg Oral Daily  . timolol  1 drop Both Eyes Daily  . vitamin B-12  500 mcg Oral Daily  . ascorbic acid  500 mg Oral Daily   Infusions:  . sodium chloride 50 mL/hr at 09/28/18 1410  . sodium chloride    . heparin 1,050 Units/hr (09/29/18 0624)    Labs: Recent Labs    09/28/18 0356 09/29/18 0013  NA 140 136  K 4.2 4.0  CL 109 104  CO2 24 22  GLUCOSE 141* 149*  BUN 44* 40*  CREATININE 1.23* 0.93  CALCIUM 11.8* 11.5*   Recent Labs    09/27/18 1337  AST 47*  ALT 43  ALKPHOS 256*  BILITOT 1.3*  PROT 6.8  ALBUMIN 2.9*   Recent Labs    09/27/18 1337 09/28/18 0356 09/29/18 0456  WBC 23.0* 16.2* 16.9*  NEUTROABS 18.4*  --   --   HGB 13.9 11.7* 12.2  HCT 44.9 36.4 39.1  MCV 96.8 93.1 95.6  PLT 177 149* 156   Recent Labs    09/27/18 1337  09/27/18 2129 09/28/18 0356 09/28/18 0952  TROPONINI 0.32* 0.82* 0.81* 0.63*   Invalid input(s): POCBNP No results for input(s): HGBA1C in the last 72 hours.   Weights: Filed Weights   09/28/18 0420 09/29/18 0500 09/29/18 0517  Weight: 66.5 kg 68.1 kg 68.1 kg     Radiology/Studies:  Dg Thoracic Spine 2 View  Result Date: 09/27/2018 CLINICAL DATA:  Weakness, back pain EXAM: THORACIC SPINE 2 VIEWS COMPARISON:  CT 05/04/2018 FINDINGS: Thoracic alignment is within normal limits. The vertebral body heights are maintained. Questionable erosions on the  left side of the T6 and T7 vertebral bodies. IMPRESSION: 1. Questionable erosive change along the left aspect of the T6 and T7 vertebral bodies. CT could be obtained for further evaluation. Electronically Signed   By: Donavan Foil M.D.   On: 09/27/2018 16:53   Dg Lumbar Spine 2-3 Views  Result Date: 09/27/2018 CLINICAL DATA:  Weakness with back pain EXAM: LUMBAR SPINE - 2-3 VIEW COMPARISON:  CT 08/10/2015 FINDINGS: Trace retrolisthesis L2 on L3 and L3 on L4. Moderate severe degenerative changes L3-L4, L1-L2 and L2-L3. Levoscoliosis of the lumbar spine. IMPRESSION: Scoliosis and marked multiple level degenerative change without acute osseous abnormality. Electronically Signed   By: Donavan Foil M.D.   On: 09/27/2018 17:12   Ct Head Wo Contrast  Result Date: 09/27/2018 CLINICAL DATA:  84 y/o  F; fall on Eliquis. Generalized weakness. EXAM: CT HEAD WITHOUT CONTRAST TECHNIQUE: Contiguous axial images were obtained from the base of the skull through the vertex without intravenous contrast. COMPARISON:  05/04/2018 MRI of the head. FINDINGS: Brain: No evidence of acute infarction, hemorrhage, hydrocephalus, extra-axial collection or mass lesion/mass effect of the brain parenchyma. Stable right lateral frontal meningioma measuring 16 mm (series 4, image 22). Few stable nonspecific white matter hypodensities compatible with mild chronic microvascular ischemic changes. Stable mild volume loss of the brain. Vascular: Mild calcific atherosclerosis of the carotid siphons. Skull: Normal. Negative for fracture or focal lesion. Sinuses/Orbits: No acute finding. Other: Borderline high-riding jugular bulb. IMPRESSION: 1. No acute intracranial abnormality. 2. Stable mild chronic microvascular ischemic changes and mild volume loss of the brain. 3. Stable right lateral frontal region meningioma. Electronically Signed   By: Kristine Garbe M.D.   On: 09/27/2018 13:57   Ct Chest W Contrast  Result Date:  09/28/2018 CLINICAL DATA:  Remote history of breast cancer. EXAM: CT CHEST, ABDOMEN, AND PELVIS WITH CONTRAST TECHNIQUE: Multidetector CT imaging of the chest, abdomen and pelvis was performed following the standard protocol during bolus administration of intravenous contrast. CONTRAST:  17mL OMNIPAQUE IOHEXOL 300 MG/ML  SOLN COMPARISON:  CT 05/04/2018 FINDINGS: CT CHEST FINDINGS Cardiovascular: No significant vascular findings. Normal heart size. No pericardial effusion. Mediastinum/Nodes: Post RIGHT mastectomy. No axillary adenopathy. Enlarged LEFT supraclavicular lymph node measures 14 mm diameter (image 4/2). Enlarged precarinal lymph node measures 12 mm short axis. Large subcarinal nodes measure up to 19 mm short axis. These nodes have developed in short interval and were not present on CT 05/04/2018 Lungs/Pleura: multiple small bilateral pulmonary nodules. There is interlobular septal thickening LEFT and RIGHT lung. The nodules are too numerous to count and new from comparison exam. Musculoskeletal: No aggressive osseous lesion. CT ABDOMEN AND PELVIS FINDINGS Hepatobiliary: \large irregular lesion in the posterior RIGHT hepatic lobe measures 7.3 by 5.9 cm is new from comparison exam. Bulky periportal adenopathy with lymph nodes measuring up to 2.7 cm short axis. Pancreas: Pancreas is normal. No ductal dilatation. No pancreatic inflammation. Spleen: Normal spleen Adrenals/urinary  tract: Adrenal glands and kidneys are normal. The ureters and bladder normal. Stomach/Bowel: Stomach, small bowel, appendix, and cecum are normal. The colon and rectosigmoid colon are normal. Vascular/Lymphatic: Abdominal aorta normal caliber. There is extensive upper abdominal retroperitoneal adenopathy and retrocrural adenopathy. Example lymph node posterior to the IVC at the level the renal veins measures 17 mm short axis. Reproductive: Post hysterectomy no adnexal abnormality Other: No peritoneal metastasis. Musculoskeletal: No  aggressive osseous lesion degenerate change of the lower lumbar spine IMPRESSION: Chest Impression: 1. New extensive mediastinal and supraclavicular adenopathy consistent with metastatic disease. 2. New bilateral pulmonary nodules air and interlobular septal thickening suggest metastatic pulmonary nodules and lymphangitic spread of carcinoma. 3. Post RIGHT mastectomy anatomy. Abdomen / Pelvis Impression: 1. Large irregular lesion in the posterior RIGHT hepatic lobe consistent metastatic leison. 2. Bulky periportal, retroperitoneal and retrocrural lymphadenopathy in the abdomen. Electronically Signed   By: Suzy Bouchard M.D.   On: 09/28/2018 16:54   Mr Thoracic Spine Wo Contrast  Result Date: 09/27/2018 CLINICAL DATA:  Suspected bone destruction on prior radiograph EXAM: MRI THORACIC SPINE WITHOUT CONTRAST TECHNIQUE: Multiplanar, multisequence MR imaging of the thoracic spine was performed. No intravenous contrast was administered. COMPARISON:  Thoracic spine radiograph 09/27/2018 Chest CT 05/04/2018 FINDINGS: Alignment:  Normal Vertebrae: There is heterogeneous marrow signal throughout the thoracic spine. There are numerous low T1 weighted signal lesions with associated edema on the inversion recovery images. The largest of these is at T7, but most of the thoracic levels are involved. Cord:  Normal signal and morphology. Paraspinal and other soft tissues: There are numerous para-aortic retroperitoneal lymph nodes. Disc levels: No spinal canal stenosis. IMPRESSION: 1. Diffusely abnormal bone marrow signal throughout the thoracic spine, which may be secondary to lymphomatous infiltration of bone marrow. Postcontrast imaging may be helpful to better define the extent. 2. No pathologic fracture. 3. Numerous enlarged retroperitoneal lymph nodes. CT or PET CT is recommended to better characterize extent. Electronically Signed   By: Ulyses Jarred M.D.   On: 09/27/2018 23:58   Ct Abdomen Pelvis W Contrast  Result  Date: 09/28/2018 CLINICAL DATA:  Number remote history of breast cancer EXAM: CT ABDOMEN AND PELVIS WITH CONTRAST TECHNIQUE: Multidetector CT imaging of the abdomen and pelvis was performed using the standard protocol following bolus administration of intravenous contrast. CONTRAST:  46mL OMNIPAQUE IOHEXOL 300 MG/ML  SOLN COMPARISON:  CT 05/04/2018 FINDINGS: CT CHEST FINDINGS Cardiovascular: No significant vascular findings. Normal heart size. No pericardial effusion. Mediastinum/Nodes: Post RIGHT mastectomy. No axillary adenopathy. Enlarged LEFT supraclavicular lymph node measures 14 mm diameter (image 4/2). Enlarged precarinal lymph node measures 12 mm short axis. Large subcarinal nodes measure up to 19 mm short axis. These nodes have developed in short interval and were not present on CT 05/04/2018 Lungs/Pleura: multiple small bilateral pulmonary nodules. There is interlobular septal thickening LEFT and RIGHT lung. The nodules are too numerous to count and new from comparison exam. Musculoskeletal: No aggressive osseous lesion. CT ABDOMEN AND PELVIS FINDINGS Hepatobiliary: \large irregular lesion in the posterior RIGHT hepatic lobe measures 7.3 by 5.9 cm is new from comparison exam. Bulky periportal adenopathy with lymph nodes measuring up to 2.7 cm short axis. Pancreas: Pancreas is normal. No ductal dilatation. No pancreatic inflammation. Spleen: Normal spleen Adrenals/urinary tract: Adrenal glands and kidneys are normal. The ureters and bladder normal. Stomach/Bowel: Stomach, small bowel, appendix, and cecum are normal. The colon and rectosigmoid colon are normal. Vascular/Lymphatic: Abdominal aorta normal caliber. There is extensive upper abdominal retroperitoneal adenopathy  and retrocrural adenopathy. Example lymph node posterior to the IVC at the level the renal veins measures 17 mm short axis. Reproductive: Post hysterectomy no adnexal abnormality Other: No peritoneal metastasis. Musculoskeletal: No  aggressive osseous lesion degenerate change of the lower lumbar spine IMPRESSION: Chest Impression: 1. New extensive mediastinal and supraclavicular adenopathy consistent with metastatic disease. 2. New bilateral pulmonary nodules air and interlobular septal thickening suggest metastatic pulmonary nodules and lymphangitic spread of carcinoma. 3. Post RIGHT mastectomy anatomy. Abdomen / Pelvis Impression: 1. Large irregular lesion in the posterior RIGHT hepatic lobe consistent metastatic leison. 2. Bulky periportal, retroperitoneal and retrocrural lymphadenopathy in the abdomen. Electronically Signed   By: Suzy Bouchard M.D.   On: 09/28/2018 17:02   Dg Chest Portable 1 View  Result Date: 09/27/2018 CLINICAL DATA:  Weakness and fall.  Tachycardia. EXAM: PORTABLE CHEST 1 VIEW COMPARISON:  07/02/2013 radiographs. 09/15/2018 chest radiograph report, but images are not available to view. FINDINGS: The cardiomediastinal silhouette is unremarkable. Interstitial prominence bilaterally noted. Elevation of the RIGHT hemidiaphragm and surgical clips in the RIGHT axillary region again identified. No pleural effusion, pneumothorax or definite airspace disease. No acute bony abnormalities are identified. IMPRESSION: Mild interstitial prominence of uncertain chronicity. Infection or mild edema are considerations if acute. No other significant abnormalities. Electronically Signed   By: Margarette Canada M.D.   On: 09/27/2018 14:01     Assessment and Recommendation  84 y.o. female with atrial fibrillation with rapid ventricular rate causing mild pulmonary edema and elevated BNP with hypertension now improved with heart rate control but still lethargic weak and fatigued with liver mass possibly causing some extra symptoms 1.  Continue heart rate control with current medical regimen of metoprolol 2.  Heparin for further risk reduction in stroke 3.  Okay for discontinuation of heparin for evaluation of liver mass as necessary  otherwise patient is at low risk for cardiovascular complication with further liver mass intervention 4.  No further cardiac diagnostics necessary at this time  Signed, Serafina Royals M.D. FACC

## 2018-09-29 NOTE — Progress Notes (Signed)
PT Cancellation Note  Patient Details Name: Sandy Hickman MRN: [id number] DOB: [birth year]   Cancelled Treatment:    Reason Eval/Treat Not Completed: Fatigue/lethargy limiting ability to participate;Other (comment)(BP in supine 99/48) Pt able to intermittently answer questions, no family at bedside to confirm answers. AAROM for all extremity movements.    Lieutenant Diego PT, DPT 3:40 PM,09/29/18 [phone]

## 2018-09-29 NOTE — Progress Notes (Addendum)
Pt returns from [place], Left supraclavicular lymph node , site looks clean, dry and intact. Pt is very lethargic, hard to arouse, BP 85/49, AFib HR 94, Spo2 96% on 2L N/C. Radiologist notified, Dr. Verdell Carmine on the floor and made aware, Per MD give 500 cc bolus. Will continue to monitor and assess.

## 2018-09-30 LAB — HEPARIN LEVEL (UNFRACTIONATED)
HEPARIN UNFRACTIONATED: 0.22 [IU]/mL — AB (ref 0.30–0.70)
HEPARIN UNFRACTIONATED: 0.56 [IU]/mL (ref 0.30–0.70)
Heparin Unfractionated: 0.67 IU/mL (ref 0.30–0.70)

## 2018-09-30 LAB — CBC
HEMATOCRIT: 35.2 % — AB (ref 36.0–46.0)
Hemoglobin: 11 g/dL — ABNORMAL LOW (ref 12.0–15.0)
MCH: 30 pg (ref 26.0–34.0)
MCHC: 31.3 g/dL (ref 30.0–36.0)
MCV: 95.9 fL (ref 80.0–100.0)
Platelets: 127 10*3/uL — ABNORMAL LOW (ref 150–400)
RBC: 3.67 MIL/uL — ABNORMAL LOW (ref 3.87–5.11)
RDW: 13.3 % (ref 11.5–15.5)
WBC: 11.6 10*3/uL — AB (ref 4.0–10.5)
nRBC: 0 % (ref 0.0–0.2)

## 2018-09-30 LAB — APTT: aPTT: 68 seconds — ABNORMAL HIGH (ref 24–36)

## 2018-09-30 MED ORDER — SODIUM CHLORIDE 0.9 % IV SOLN
INTRAVENOUS | Status: DC
  Administered 2018-09-30 – 2018-10-01 (×2): via INTRAVENOUS

## 2018-09-30 MED ORDER — MORPHINE SULFATE (PF) 2 MG/ML IV SOLN: 1.0000 mg | INTRAVENOUS | Status: DC | PRN

## 2018-09-30 MED ORDER — BENZONATATE 100 MG PO CAPS
100.0000 mg | ORAL_CAPSULE | Freq: Two times a day (BID) | ORAL | Status: DC | PRN
  Administered 2018-09-30: 100 mg via ORAL
  Filled 2018-09-30: qty 1

## 2018-09-30 NOTE — Consult Note (Signed)
ANTICOAGULATION CONSULT NOTE - Initial Consult  Pharmacy Consult for Heparin Indication: atrial fibrillation  Allergies  Allergen Reactions  . Neosporin [Neomycin-Bacitracin Zn-Polymyx] Dermatitis  . Sulfa Antibiotics Itching and Rash  . Oxycodone-Acetaminophen Itching    Patient Measurements: Height: 5\' 5"  (165.1 cm) Weight: 153 lb 4.8 oz (69.5 kg) IBW/kg (Calculated) : 57 Heparin Dosing Weight: 68   Vital Signs: Temp: 98 F (36.7 C) (11/07 2008) Temp Source: Oral (11/07 2008) BP: 105/45 (11/07 2008) Pulse Rate: 87 (11/07 2008)  Labs: Recent Labs    09/28/18 0356  09/28/18 0952  09/29/18 0013 09/29/18 0456 09/29/18 0811 09/29/18 1341 09/30/18 0252 09/30/18 1106 09/30/18 2250  HGB 11.7*  --   --   --   --  12.2  --   --  11.0*  --   --   HCT 36.4  --   --   --   --  39.1  --   --  35.2*  --   --   PLT 149*  --   --   --   --  156  --   --  127*  --   --   APTT  --    < >  --    < > 85* 107*  --  26 68*  --   --   LABPROT  --   --   --   --   --   --  14.1  --   --   --   --   INR  --   --   --   --   --   --  1.10  --   --   --   --   HEPARINUNFRC  --    < >  --   --   --  1.54*  --   --  0.67 0.22* 0.56  CREATININE 1.23*  --   --   --  0.93  --   --   --   --   --   --   TROPONINI 0.81*  --  0.63*  --   --   --   --   --   --   --   --    < > = values in this interval not displayed.    Estimated Creatinine Clearance: 45.6 mL/min (by C-G formula based on SCr of 0.93 mg/dL).   Medical History: Past Medical History:  Diagnosis Date  . Allergy   . Arrhythmia   . Arthritis   . Atrial fibrillation ([place])   . Breast cancer ([place]) 2006   rt mastectomy  . Chronic kidney disease    chronic uti, kidney stones  . GERD (gastroesophageal reflux disease)   . Glaucoma   . HLD (hyperlipidemia)   . Hypertension   . 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 Personal history of chemotherapy   . Personal history of radiation therapy   . 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  (last dose 11/4 @ 1000) HL elevated at baseline  11/7 patient is having nosebleed, Heparin stopped.  11/7 15:20 Heparin restarted at 1050 units/hr.  Goal of Therapy:  Heparin level 0.3-0.7 units/ml Monitor platelets by anticoagulation protocol: Yes   Plan:  11/07 @ 2300 HL 0.56 therapeutic. Will continue current rate and will recheck @ 0700.  Shanon Brow  Estil Daft, PharmD, BCPS Clinical Pharmacist 09/30/2018

## 2018-09-30 NOTE — Progress Notes (Signed)
Patient having nosebleed and Hgb dropped overnight.  Heparin gtt on hold for now.  Dr. Verdell Carmine aware.

## 2018-09-30 NOTE — Progress Notes (Signed)
[facility] [place] NAME: Sandy Hickman    MR#:  [id number]  DATE OF BIRTH:  June 02, 1934  SUBJECTIVE:   Patient had a nosebleed this morning and heparin drip was stopped.  Status post ultrasound-guided liver biopsy yesterday.  Patient noted to have metastatic carcinoma but primary unknown.  Patient's daughter is at bedside.  REVIEW OF SYSTEMS:    Review of Systems  Constitutional: Positive for malaise/fatigue. Negative for chills and fever.  HENT: Negative for congestion and tinnitus.   Eyes: Negative for blurred vision and double vision.  Respiratory: Negative for cough, shortness of breath and wheezing.   Cardiovascular: Negative for chest pain, orthopnea and PND.  Gastrointestinal: Negative for abdominal pain, diarrhea, nausea and vomiting.  Genitourinary: Negative for dysuria and hematuria.  Musculoskeletal: Negative for back pain.  Neurological: Positive for weakness (generalized). Negative for dizziness, sensory change and focal weakness.  All other systems reviewed and are negative.   Nutrition: Heart healthy Tolerating Diet: Yes some Tolerating PT: Await Eval.   DRUG ALLERGIES:   Allergies  Allergen Reactions  . Neosporin [Neomycin-Bacitracin Zn-Polymyx] Dermatitis  . Sulfa Antibiotics Itching and Rash  . Oxycodone-Acetaminophen Itching    VITALS:  Blood pressure 112/61, pulse 84, temperature 98.3 F (36.8 C), temperature source Oral, resp. rate 18, height 5\' 5"  (1.651 m), weight 69.5 kg, SpO2 97 %.  PHYSICAL EXAMINATION:   Physical Exam  GENERAL:  84 y.o.-year-old pale appearing patient lying in bed in no acute distress.  EYES: Pupils equal, round, reactive to light and accommodation. No scleral icterus. Extraocular muscles intact.  HEENT: Head atraumatic, normocephalic. Oropharynx and nasopharynx clear.  NECK:  Supple, no jugular venous distention. No thyroid enlargement, no tenderness.  LUNGS: Normal breath  sounds bilaterally, no wheezing, rales, rhonchi. No use of accessory muscles of respiration.  CARDIOVASCULAR: S1, S2 normal. No murmurs, rubs, or gallops.  ABDOMEN: Soft, nontender, nondistended. Bowel sounds present. No organomegaly or mass.  EXTREMITIES: No cyanosis, clubbing or edema b/l.    NEUROLOGIC: Cranial nerves II through XII are intact. No focal Motor or sensory deficits b/l. Globally weak.   PSYCHIATRIC: The patient is alert and oriented x 3.  SKIN: No obvious rash, lesion, or ulcer.    LABORATORY PANEL:   CBC Recent Labs  Lab 09/30/18 0252  WBC 11.6*  HGB 11.0*  HCT 35.2*  PLT 127*   ------------------------------------------------------------------------------------------------------------------  Chemistries  Recent Labs  Lab 09/27/18 1337  09/29/18 0013  NA 138   < > 136  K 4.6   < > 4.0  CL 104   < > 104  CO2 18*   < > 22  GLUCOSE 288*   < > 149*  BUN 47*   < > 40*  CREATININE 1.65*   < > 0.93  CALCIUM 12.9*   < > 11.5*  AST 47*  --   --   ALT 43  --   --   ALKPHOS 256*  --   --   BILITOT 1.3*  --   --    < > = values in this interval not displayed.   ------------------------------------------------------------------------------------------------------------------  Cardiac Enzymes Recent Labs  Lab 09/28/18 0952  TROPONINI 0.63*   ------------------------------------------------------------------------------------------------------------------  RADIOLOGY:  Ct Chest W Contrast  Result Date: 09/28/2018 CLINICAL DATA:  Remote history of breast cancer. EXAM: CT CHEST, ABDOMEN, AND PELVIS WITH CONTRAST TECHNIQUE: Multidetector CT imaging of the chest, abdomen and pelvis was performed following the standard  protocol during bolus administration of intravenous contrast. CONTRAST:  62mL OMNIPAQUE IOHEXOL 300 MG/ML  SOLN COMPARISON:  CT 05/04/2018 FINDINGS: CT CHEST FINDINGS Cardiovascular: No significant vascular findings. Normal heart size. No pericardial  effusion. Mediastinum/Nodes: Post RIGHT mastectomy. No axillary adenopathy. Enlarged LEFT supraclavicular lymph node measures 14 mm diameter (image 4/2). Enlarged precarinal lymph node measures 12 mm short axis. Large subcarinal nodes measure up to 19 mm short axis. These nodes have developed in short interval and were not present on CT 05/04/2018 Lungs/Pleura: multiple small bilateral pulmonary nodules. There is interlobular septal thickening LEFT and RIGHT lung. The nodules are too numerous to count and new from comparison exam. Musculoskeletal: No aggressive osseous lesion. CT ABDOMEN AND PELVIS FINDINGS Hepatobiliary: \large irregular lesion in the posterior RIGHT hepatic lobe measures 7.3 by 5.9 cm is new from comparison exam. Bulky periportal adenopathy with lymph nodes measuring up to 2.7 cm short axis. Pancreas: Pancreas is normal. No ductal dilatation. No pancreatic inflammation. Spleen: Normal spleen Adrenals/urinary tract: Adrenal glands and kidneys are normal. The ureters and bladder normal. Stomach/Bowel: Stomach, small bowel, appendix, and cecum are normal. The colon and rectosigmoid colon are normal. Vascular/Lymphatic: Abdominal aorta normal caliber. There is extensive upper abdominal retroperitoneal adenopathy and retrocrural adenopathy. Example lymph node posterior to the IVC at the level the renal veins measures 17 mm short axis. Reproductive: Post hysterectomy no adnexal abnormality Other: No peritoneal metastasis. Musculoskeletal: No aggressive osseous lesion degenerate change of the lower lumbar spine IMPRESSION: Chest Impression: 1. New extensive mediastinal and supraclavicular adenopathy consistent with metastatic disease. 2. New bilateral pulmonary nodules air and interlobular septal thickening suggest metastatic pulmonary nodules and lymphangitic spread of carcinoma. 3. Post RIGHT mastectomy anatomy. Abdomen / Pelvis Impression: 1. Large irregular lesion in the posterior RIGHT hepatic lobe  consistent metastatic leison. 2. Bulky periportal, retroperitoneal and retrocrural lymphadenopathy in the abdomen. Electronically Signed   By: Suzy Bouchard M.D.   On: 09/28/2018 16:54   Ct Abdomen Pelvis W Contrast  Result Date: 09/28/2018 CLINICAL DATA:  Number remote history of breast cancer EXAM: CT ABDOMEN AND PELVIS WITH CONTRAST TECHNIQUE: Multidetector CT imaging of the abdomen and pelvis was performed using the standard protocol following bolus administration of intravenous contrast. CONTRAST:  54mL OMNIPAQUE IOHEXOL 300 MG/ML  SOLN COMPARISON:  CT 05/04/2018 FINDINGS: CT CHEST FINDINGS Cardiovascular: No significant vascular findings. Normal heart size. No pericardial effusion. Mediastinum/Nodes: Post RIGHT mastectomy. No axillary adenopathy. Enlarged LEFT supraclavicular lymph node measures 14 mm diameter (image 4/2). Enlarged precarinal lymph node measures 12 mm short axis. Large subcarinal nodes measure up to 19 mm short axis. These nodes have developed in short interval and were not present on CT 05/04/2018 Lungs/Pleura: multiple small bilateral pulmonary nodules. There is interlobular septal thickening LEFT and RIGHT lung. The nodules are too numerous to count and new from comparison exam. Musculoskeletal: No aggressive osseous lesion. CT ABDOMEN AND PELVIS FINDINGS Hepatobiliary: \large irregular lesion in the posterior RIGHT hepatic lobe measures 7.3 by 5.9 cm is new from comparison exam. Bulky periportal adenopathy with lymph nodes measuring up to 2.7 cm short axis. Pancreas: Pancreas is normal. No ductal dilatation. No pancreatic inflammation. Spleen: Normal spleen Adrenals/urinary tract: Adrenal glands and kidneys are normal. The ureters and bladder normal. Stomach/Bowel: Stomach, small bowel, appendix, and cecum are normal. The colon and rectosigmoid colon are normal. Vascular/Lymphatic: Abdominal aorta normal caliber. There is extensive upper abdominal retroperitoneal adenopathy and  retrocrural adenopathy. Example lymph node posterior to the IVC at the level the  renal veins measures 17 mm short axis. Reproductive: Post hysterectomy no adnexal abnormality Other: No peritoneal metastasis. Musculoskeletal: No aggressive osseous lesion degenerate change of the lower lumbar spine IMPRESSION: Chest Impression: 1. New extensive mediastinal and supraclavicular adenopathy consistent with metastatic disease. 2. New bilateral pulmonary nodules air and interlobular septal thickening suggest metastatic pulmonary nodules and lymphangitic spread of carcinoma. 3. Post RIGHT mastectomy anatomy. Abdomen / Pelvis Impression: 1. Large irregular lesion in the posterior RIGHT hepatic lobe consistent metastatic leison. 2. Bulky periportal, retroperitoneal and retrocrural lymphadenopathy in the abdomen. Electronically Signed   By: Suzy Bouchard M.D.   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 Please perform ultrasound-guided biopsy of left supraclavicular lymph node for tissue diagnostic purposes. EXAM: ULTRASOUND-GUIDED BIOPSY LEFT SUPRACLAVICULAR LYMPH NODE COMPARISON:  CT the chest, abdomen and pelvis-10/08/2018 MEDICATIONS: None ANESTHESIA/SEDATION: Moderate (conscious) sedation was employed during this procedure. A total of Versed 1 mg and Fentanyl 50 mcg was administered intravenously. Moderate Sedation Time: 16 minutes. The patient's level of consciousness and vital signs were monitored continuously by radiology nursing throughout the procedure under my direct supervision. COMPLICATIONS: None immediate. TECHNIQUE: Informed written consent was obtained from the patient after a discussion of the risks, benefits and alternatives to treatment. Questions regarding the procedure were encouraged and answered. Initial ultrasound scanning demonstrated  enlarged at least 2.2 x 3.2 x 1.0 cm left supraclavicular lymph node correlating (images 2-10) with the dominant left supraclavicular lymph node seen on preceding chest CT (image 5, series 2). An ultrasound image was saved for documentation purposes. The procedure was planned. A timeout was performed prior to the initiation of the procedure. The operative was prepped and draped in the usual sterile fashion, and a sterile drape was applied covering the operative field. A timeout was performed prior to the initiation of the procedure. Local anesthesia was provided with 1% lidocaine with epinephrine. Under direct ultrasound guidance, an 18 gauge core needle device was utilized to obtain to obtain 7 core needle biopsies of the dominant indeterminate left supraclavicular lymph node. The samples were placed in saline and submitted to pathology. The needle was removed and hemostasis was achieved with manual compression. Post procedure scan was negative for significant hematoma. A dressing was placed. The patient tolerated the procedure well without immediate postprocedural complication. IMPRESSION: Technically successful ultrasound guided biopsy of dominant indeterminate left supraclavicular lymph node. Electronically Signed   By: Sandi Mariscal M.D.   On: 09/29/2018 13:43     ASSESSMENT AND PLAN:   84 yo female w/ hx of Breast Cancer, Marginal Zone Lymphoma, HTN, hx of A. Fib, hx of Neuropathy, Glaucoma, who presented to the hospital who presented to the hospital due to weakness and falls and noted to be in a. Fib w/ RVR.    1.  Atrial fibrillation with rapid ventricular response- patient presented with elevated heart rates and much improved now and off Cardizem gtt.  - Continue Toprol.  -pt. Had a nosebleed this a.m. And heparin gtt stopped. o2 humidified and will resume heparin gtt this afternoon.   2.  Metastatic Cancer/unknown primary-patient has been having back pain with recurrent falls which has  progressively gotten worse.  She had a MRI of the thoracic spine done which shows diffusely abnormal bone marrow through the entire thoracic spine suggestive of lymphomatous infiltration.  Patient has a previous history of marginal  cell lymphoma. - Discussed with oncology Dr. Grayland Ormond who recommended getting a CT scan of the chest abdomen pelvis which showed mediastinal and supraclavicular lymphadenopathy along with a large liver mass and also retroperitoneal lymphadenopathy.   - s/p US guided liver biopsy yesterday but path pending.  Care consulted and patient's prognosis is poor.  Patient is now a DNR.  Palliative care to follow and further discuss goals of care pending pathology results.  3.  Hypercalcemia- improving and continue IV fluids, got 1 dose of Zometa. Will follow calcium level.  4.  Glaucoma-continue Latanoprost, Timolol eye drops.   5. GERD - cont. Protonix.   6. Neuropathy - cont. Neurontin.   Explained plan of care with patient's family/daughter at bedside.  All the records are reviewed and case discussed with Care Management/Social Worker. Management plans discussed with the patient, family and they are in agreement.  CODE STATUS: DNR  DVT Prophylaxis: Hep. gtt  TOTAL TIME TAKING CARE OF THIS PATIENT: 35 minutes.   POSSIBLE D/C IN 2-3 DAYS, DEPENDING ON CLINICAL CONDITION.   Henreitta Leber M.D on 09/30/2018 at 3:10 PM  Between 7am to 6pm - Pager - [phone]  After 6pm go to [email or web address] - [employer] [facility]  Office  [phone]  CC: Primary care physician; Ezequiel Kayser, MD

## 2018-09-30 NOTE — Plan of Care (Signed)
  Problem: Education: Goal: Knowledge of General Education information will improve Description: Including pain rating scale, medication(s)/side effects and non-pharmacologic comfort measures Outcome: Progressing   Problem: Safety: Goal: Ability to remain free from injury will improve Outcome: Progressing   Problem: Skin Integrity: Goal: Risk for impaired skin integrity will decrease Outcome: Progressing   

## 2018-09-30 NOTE — Progress Notes (Signed)
I met with patient and her daughter.  Together we reviewed patient's medical problems.  Patient's mentation is significantly improved today.  However, her oral intake remains minimal.  Both patient and daughter recognize that the cancer is stage IV and likely aggressive.  Treatment options would likely be limited based on her performance status (Current ECOG 3-4).  Daughter does not think that she can take patient home and care for her appropriately.  They also do not think that rehab would be desirable.  We discussed hospice involvement.  Both daughter and patient would be interested in the hospice home.  Will consult social worker to help with hospice referral.  Case discussed with Dr. Grayland Ormond.   Plan: SW referral [place]

## 2018-09-30 NOTE — Consult Note (Signed)
ANTICOAGULATION CONSULT NOTE - Initial Consult  Pharmacy Consult for Heparin Indication: atrial fibrillation  Allergies  Allergen Reactions  . Neosporin [Neomycin-Bacitracin Zn-Polymyx] Dermatitis  . Sulfa Antibiotics Itching and Rash  . Oxycodone-Acetaminophen Itching    Patient Measurements: Height: 5\' 5"  (165.1 cm) Weight: 150 lb 1.6 oz (68.1 kg) IBW/kg (Calculated) : 57 Heparin Dosing Weight: 68   Vital Signs: Temp: 98.6 F (37 C) (11/06 2111) Temp Source: Oral (11/06 2111) BP: 101/73 (11/06 2111) Pulse Rate: 92 (11/06 2111)  Labs: Recent Labs    09/27/18 1337  09/27/18 1622 09/27/18 2129 09/28/18 0356 09/28/18 0605 09/28/18 0952  09/29/18 0013 09/29/18 0456 09/29/18 0811 09/29/18 1341 09/30/18 0252  HGB 13.9  --   --   --  11.7*  --   --   --   --  12.2  --   --  11.0*  HCT 44.9  --   --   --  36.4  --   --   --   --  39.1  --   --  35.2*  PLT 177  --   --   --  149*  --   --   --   --  156  --   --  127*  APTT  --    < > <24*  --   --  77*  --    < > 85* 107*  --  26 68*  LABPROT  --   --  15.2  --   --   --   --   --   --   --  14.1  --   --   INR  --   --  1.21  --   --   --   --   --   --   --  1.10  --   --   HEPARINUNFRC  --    < > 1.74*  --   --  2.00*  --   --   --  1.54*  --   --  0.67  CREATININE 1.65*  --   --   --  1.23*  --   --   --  0.93  --   --   --   --   TROPONINI 0.32*  --   --  0.82* 0.81*  --  0.63*  --   --   --   --   --   --    < > = values in this interval not displayed.    Estimated Creatinine Clearance: 42 mL/min (by C-G formula based on SCr of 0.93 mg/dL).   Medical History: Past Medical History:  Diagnosis Date  . Allergy   . Arrhythmia   . Arthritis   . Atrial fibrillation ([place])   . Breast cancer ([place]) 2006   rt mastectomy  . Chronic kidney disease    chronic uti, kidney stones  . GERD (gastroesophageal reflux disease)   . Glaucoma   . HLD (hyperlipidemia)   . Hypertension   . Marginal zone lymphoma ([place])    left orbit  . Personal history of chemotherapy   . Personal history of radiation therapy   . 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  (last dose 11/4 @ 1000) HL elevated at baseline  Heparin being resumes post liver biposy     Goal of Therapy:  Heparin level 0.3-0.7 units/ml Monitor platelets by anticoagulation protocol: Yes   Plan:  11/07 @ 0300 HL 0.67, aPTT 68 seconds. Both levels therapeutic and correlating--will dose off of HL and will continue current rate and will recheck HL @ 1100.   Tobie Lords, PharmD Clinical Pharmacist 09/30/2018 5:11 AM

## 2018-09-30 NOTE — Care Management Important Message (Signed)
Copy of signed IM left with patient in room.  

## 2018-09-30 NOTE — Progress Notes (Signed)
PT Cancellation Note  Patient Details Name: Sandy Hickman MRN: [id number] DOB: [birth year]   Cancelled Treatment:    Reason Eval/Treat Not Completed: Patient declined, no reason specified;Other (comment)(Pt reported that she is very tired, and does not want to move around at this time. Understands that PT may or may not be able to re-attempt this PM. Nursing staff notified.)   Lieutenant Diego PT, DPT 12:11 PM,09/30/18 [phone]

## 2018-09-30 NOTE — Clinical Social Work Note (Signed)
CSW was informed by bedside nurse that patient's daughter Jeani Hawking,  wanted to speak to CSW about discharge planning.  CSW spoke to patient's daughter and she was asking about different options for patient.  Daughter was asking about going to hospice facility verse receiving hospice at home verse going to a SNF with hospice services to follow.  Patient's daughter was trying to decide what needs to be done for patient.  Patient's daughter feels that patient should not go through with chemo, patient told daughter that she wants to keep fighting, palliative talked to patient's daughter yesterday via phone and discussed different options.  Per palliative, daughter can discuss with her brother and patient what the next step is going to be.  Patient's daughter reported that oncology said the cancer has spread throughout patient, CSW read in palliative's note, that the biopsy results are still pending, and family is not sure what they want to do yet.  Patient's daughter is trying to figure out what she wants to do.  If patient is appropriate for hospice home patient's daughter w Clance Boll like to go to [facility] and [facility] in [place], if she needs to go to to SNF and private pay with hospice she is considering this option, or if she wants patient to com  She is not sure what the next step will be.  Palliative to meet with patient again and discuss what her plan is for diccharge next.  CSW to continue to follow patien't's progress throughout discharge planning.                                                                                Jones Broom. [place], MSW, [place]  09/30/2018 5:18 PM

## 2018-09-30 NOTE — Consult Note (Signed)
ANTICOAGULATION CONSULT NOTE - Initial Consult  Pharmacy Consult for Heparin Indication: atrial fibrillation  Allergies  Allergen Reactions  . Neosporin [Neomycin-Bacitracin Zn-Polymyx] Dermatitis  . Sulfa Antibiotics Itching and Rash  . Oxycodone-Acetaminophen Itching    Patient Measurements: Height: 5\' 5"  (165.1 cm) Weight: 153 lb 4.8 oz (69.5 kg) IBW/kg (Calculated) : 57 Heparin Dosing Weight: 68   Vital Signs: Temp: 98.3 F (36.8 C) (11/07 0751) Temp Source: Oral (11/07 0751) BP: 112/61 (11/07 0751) Pulse Rate: 84 (11/07 0751)  Labs: Recent Labs    09/27/18 1622 09/27/18 2129  09/28/18 0356  09/28/18 0952  09/29/18 0013 09/29/18 0456 09/29/18 0811 09/29/18 1341 09/30/18 0252 09/30/18 1106  HGB  --   --    < > 11.7*  --   --   --   --  12.2  --   --  11.0*  --   HCT  --   --   --  36.4  --   --   --   --  39.1  --   --  35.2*  --   PLT  --   --   --  149*  --   --   --   --  156  --   --  127*  --   APTT <24*  --   --   --    < >  --    < > 85* 107*  --  26 68*  --   LABPROT 15.2  --   --   --   --   --   --   --   --  14.1  --   --   --   INR 1.21  --   --   --   --   --   --   --   --  1.10  --   --   --   HEPARINUNFRC 1.74*  --   --   --    < >  --   --   --  1.54*  --   --  0.67 0.22*  CREATININE  --   --   --  1.23*  --   --   --  0.93  --   --   --   --   --   TROPONINI  --  0.82*  --  0.81*  --  0.63*  --   --   --   --   --   --   --    < > = values in this interval not displayed.    Estimated Creatinine Clearance: 45.6 mL/min (by C-G formula based on SCr of 0.93 mg/dL).   Medical History: Past Medical History:  Diagnosis Date  . Allergy   . Arrhythmia   . Arthritis   . Atrial fibrillation ([place])   . Breast cancer ([place]) 2006   rt mastectomy  . Chronic kidney disease    chronic uti, kidney stones  . GERD (gastroesophageal reflux disease)   . Glaucoma   . HLD (hyperlipidemia)   . Hypertension   . Marginal zone lymphoma ([place])    left orbit   . Personal history of chemotherapy   . Personal history of radiation therapy   . 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  (last dose 11/4 @ 1000) HL elevated at baseline  11/7  patient is having nosebleed, Heparin stopped.  11/7 15:20 Heparin restarted at 1050 units/hr.  Goal of Therapy:  Heparin level 0.3-0.7 units/ml Monitor platelets by anticoagulation protocol: Yes   Plan:  Will check a Heparin level in 8 hours. RN will watch for further bleeding. Daily CBC while on Heparin drip.  Paulina Fusi, PharmD, BCPS 09/30/2018 3:34 PM

## 2018-09-30 NOTE — Progress Notes (Signed)
[place]  [phone] [phone]   Name: Sandy Hickman Date: 09/30/2018 MRN: [id number]  DOB: 10/05/1934  Patient Care Team: Ezequiel Kayser, MD as PCP - General (Internal Medicine)    REASON FOR CONSULTATION: Palliative Care consult requested for this 84 y.o. female with multiple medical problems including marginal zone lymphoma status post radiation, A. fib on Eliquis, history of breast cancer status post right mastectomy, and CKD, who was admitted on 09/27/2018 with progressive weakness over the past few weeks.  She was found to have A. fib with RVR, acute CHF, and hypercalcemia.  CT of chest/abdomen/pelvis reveal extensive new mediastinal and supraclavicular adenopathy, bilateral pulmonary nodules with probable lymphangitic spread, a large hepatic mass, and bulky retroperitoneal lymphadenopathy suggestive of widely metastatic disease.  Of note, this disease was not evident at the time of last CT on 05/04/2018.  Patient is status post biopsy of the left supraclavicular lymph node.  Pathology is pending.  Palliative care was asked to help establish goals of care.  CODE STATUS: DNR  PAST MEDICAL HISTORY: Past Medical History:  Diagnosis Date  . Allergy   . Arrhythmia   . Arthritis   . Atrial fibrillation ([place])   . Breast cancer ([place]) 2006   rt mastectomy  . Chronic kidney disease    chronic uti, kidney stones  . GERD (gastroesophageal reflux disease)   . Glaucoma   . HLD (hyperlipidemia)   . Hypertension   . Marginal zone lymphoma ([place])    left orbit  . Personal history of chemotherapy   . Personal history of radiation therapy   . 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 ABDOMINAL HYSTERECTOMY    . BLADDER SUSPENSION     15 years ago  . BREAST SURGERY    . cataract surgery Bilateral   . CHOLECYSTECTOMY    .  JOINT REPLACEMENT    . KNEE SURGERY Right    2016  . MASTECTOMY Right 2006   rt mast /rad/chemo  . ROTATOR CUFF REPAIR     13 years    HEMATOLOGY/ONCOLOGY HISTORY:   No history exists.    ALLERGIES:  is allergic to neosporin [neomycin-bacitracin zn-polymyx]; sulfa antibiotics; and oxycodone-acetaminophen.  MEDICATIONS:  Current Facility-Administered Medications  Medication Dose Route Frequency Provider Last Rate Last Dose  . 0.9 %  sodium chloride infusion   Intravenous Continuous Henreitta Leber, MD 50 mL/hr at 09/29/18 1525    . acetaminophen (TYLENOL) tablet 650 mg  650 mg Oral Q6H PRN Gladstone Lighter, MD       Or  . acetaminophen (TYLENOL) suppository 650 mg  650 mg Rectal Q6H PRN Gladstone Lighter, MD      . acidophilus (RISAQUAD) capsule 1 capsule  1 capsule Oral Daily Gladstone Lighter, MD   1 capsule at 09/30/18 0910  . feeding supplement (ENSURE ENLIVE) (ENSURE ENLIVE) liquid 237 mL  237 mL Oral TID BM Henreitta Leber, MD   237 mL at 09/29/18 2112  . ferrous sulfate tablet 325 mg  325 mg Oral Q breakfast Gladstone Lighter, MD   325 mg at 09/30/18 0909  . gabapentin (NEURONTIN) capsule 300 mg  300 mg Oral QHS Gladstone Lighter, MD   300 mg at 09/29/18 2113  . heparin ADULT infusion 100 units/mL (25000 units/255mL sodium chloride 0.45%)  1,050 Units/hr Intravenous Continuous Maccia, Melissa D,  [place]   Stopped at 09/30/18 0819  . latanoprost (XALATAN) 0.005 % ophthalmic solution 1 drop  1 drop Both Eyes QHS Gladstone Lighter, MD   1 drop at 09/29/18 2113  . loratadine (CLARITIN) tablet 10 mg  10 mg Oral Daily PRN Gladstone Lighter, MD      . Melatonin TABS 10 mg  10 mg Oral QHS Henreitta Leber, MD   10 mg at 09/29/18 2113  . metoprolol succinate (TOPROL-XL) 24 hr tablet 25 mg  25 mg Oral Daily Gladstone Lighter, MD   25 mg at 09/30/18 0910  . mirtazapine (REMERON) tablet 15 mg  15 mg Oral QHS Gladstone Lighter, MD   15 mg at 09/29/18 2113  . multivitamin with  minerals tablet 1 tablet  1 tablet Oral Daily Gladstone Lighter, MD   1 tablet at 09/30/18 0910  . omega-3 acid ethyl esters (LOVAZA) capsule 1 g  1 g Oral Daily Gladstone Lighter, MD   1 g at 09/30/18 0910  . ondansetron (ZOFRAN) tablet 4 mg  4 mg Oral Q6H PRN Gladstone Lighter, MD       Or  . ondansetron (ZOFRAN) injection 4 mg  4 mg Intravenous Q6H PRN Gladstone Lighter, MD      . pantoprazole (PROTONIX) EC tablet 40 mg  40 mg Oral Daily Gladstone Lighter, MD   40 mg at 09/30/18 0910  . timolol (TIMOPTIC) 0.5 % ophthalmic solution 1 drop  1 drop Both Eyes Daily Gladstone Lighter, MD   1 drop at 09/30/18 0913  . traMADol (ULTRAM) tablet 50 mg  50 mg Oral Q6H PRN Gladstone Lighter, MD   50 mg at 09/28/18 0941  . vitamin B-12 (CYANOCOBALAMIN) tablet 500 mcg  500 mcg Oral Daily Gladstone Lighter, MD   500 mcg at 09/30/18 0910  . vitamin C (ASCORBIC ACID) tablet 500 mg  500 mg Oral Daily Gladstone Lighter, MD   500 mg at 09/30/18 0910  . zolpidem (AMBIEN) tablet 5 mg  5 mg Oral QHS PRN Lance Coon, MD   5 mg at 09/28/18 2131    VITAL SIGNS: BP 112/61 (BP Location: Left Arm)   Pulse 84   Temp 98.3 F (36.8 C) (Oral)   Resp 18   Ht 5\' 5"  (1.651 m)   Wt 153 lb 4.8 oz (69.5 kg)   SpO2 97%   BMI 25.51 kg/m  Filed Weights   09/29/18 0500 09/29/18 0517 09/30/18 0550  Weight: 150 lb 2.1 oz (68.1 kg) 150 lb 1.6 oz (68.1 kg) 153 lb 4.8 oz (69.5 kg)    Estimated body mass index is 25.51 kg/m as calculated from the following:   Height as of this encounter: 5\' 5"  (1.651 m).   Weight as of this encounter: 153 lb 4.8 oz (69.5 kg).  LABS: CBC:    Component Value Date/Time   WBC 11.6 (H) 09/30/2018 0252   HGB 11.0 (L) 09/30/2018 0252   HGB 9.4 (L) 07/04/2013 0445   HCT 35.2 (L) 09/30/2018 0252   HCT 27.5 (L) 07/04/2013 0445   PLT 127 (L) 09/30/2018 0252   PLT 277 07/04/2013 0445   MCV 95.9 09/30/2018 0252   MCV 89 07/04/2013 0445   NEUTROABS 18.4 (H) 09/27/2018 1337   NEUTROABS  9.5 (H) 07/04/2013 0445   LYMPHSABS 1.8 09/27/2018 1337   LYMPHSABS 2.4 07/04/2013 0445   MONOABS 1.5 (H) 09/27/2018 1337   MONOABS 1.0 (H) 07/04/2013 0445   EOSABS 0.9 (H) 09/27/2018 1337   EOSABS 0.0 07/04/2013  0445   BASOSABS 0.1 09/27/2018 1337   BASOSABS 0.0 07/04/2013 0445   Comprehensive Metabolic Panel:    Component Value Date/Time   NA 136 09/29/2018 0013   NA 136 07/04/2013 0445   K 4.0 09/29/2018 0013   K 5.1 07/04/2013 0445   CL 104 09/29/2018 0013   CL 106 07/04/2013 0445   CO2 22 09/29/2018 0013   CO2 24 07/04/2013 0445   BUN 40 (H) 09/29/2018 0013   BUN 17 07/04/2013 0445   CREATININE 0.93 09/29/2018 0013   CREATININE 0.95 07/04/2013 0445   GLUCOSE 149 (H) 09/29/2018 0013   GLUCOSE 125 (H) 07/04/2013 0445   CALCIUM 11.5 (H) 09/29/2018 0013   CALCIUM 8.7 07/04/2013 0445   AST 47 (H) 09/27/2018 1337   AST 30 07/02/2013 1355   ALT 43 09/27/2018 1337   ALT 30 07/02/2013 1355   ALKPHOS 256 (H) 09/27/2018 1337   ALKPHOS 105 07/02/2013 1355   BILITOT 1.3 (H) 09/27/2018 1337   BILITOT 0.5 07/02/2013 1355   PROT 6.8 09/27/2018 1337   PROT 8.1 07/02/2013 1355   ALBUMIN 2.9 (L) 09/27/2018 1337   ALBUMIN 3.5 07/02/2013 1355    RADIOGRAPHIC STUDIES: Dg Thoracic Spine 2 View  Result Date: 09/27/2018 CLINICAL DATA:  Weakness, back pain EXAM: THORACIC SPINE 2 VIEWS COMPARISON:  CT 05/04/2018 FINDINGS: Thoracic alignment is within normal limits. The vertebral body heights are maintained. Questionable erosions on the left side of the T6 and T7 vertebral bodies. IMPRESSION: 1. Questionable erosive change along the left aspect of the T6 and T7 vertebral bodies. CT could be obtained for further evaluation. Electronically Signed   By: Donavan Foil M.D.   On: 09/27/2018 16:53   Dg Lumbar Spine 2-3 Views  Result Date: 09/27/2018 CLINICAL DATA:  Weakness with back pain EXAM: LUMBAR SPINE - 2-3 VIEW COMPARISON:  CT 08/10/2015 FINDINGS: Trace retrolisthesis L2 on L3 and L3 on  L4. Moderate severe degenerative changes L3-L4, L1-L2 and L2-L3. Levoscoliosis of the lumbar spine. IMPRESSION: Scoliosis and marked multiple level degenerative change without acute osseous abnormality. Electronically Signed   By: Donavan Foil M.D.   On: 09/27/2018 17:12   Ct Head Wo Contrast  Result Date: 09/27/2018 CLINICAL DATA:  84 y/o  F; fall on Eliquis. Generalized weakness. EXAM: CT HEAD WITHOUT CONTRAST TECHNIQUE: Contiguous axial images were obtained from the base of the skull through the vertex without intravenous contrast. COMPARISON:  05/04/2018 MRI of the head. FINDINGS: Brain: No evidence of acute infarction, hemorrhage, hydrocephalus, extra-axial collection or mass lesion/mass effect of the brain parenchyma. Stable right lateral frontal meningioma measuring 16 mm (series 4, image 22). Few stable nonspecific white matter hypodensities compatible with mild chronic microvascular ischemic changes. Stable mild volume loss of the brain. Vascular: Mild calcific atherosclerosis of the carotid siphons. Skull: Normal. Negative for fracture or focal lesion. Sinuses/Orbits: No acute finding. Other: Borderline high-riding jugular bulb. IMPRESSION: 1. No acute intracranial abnormality. 2. Stable mild chronic microvascular ischemic changes and mild volume loss of the brain. 3. Stable right lateral frontal region meningioma. Electronically Signed   By: Kristine Garbe M.D.   On: 09/27/2018 13:57   Ct Chest W Contrast  Result Date: 09/28/2018 CLINICAL DATA:  Remote history of breast cancer. EXAM: CT CHEST, ABDOMEN, AND PELVIS WITH CONTRAST TECHNIQUE: Multidetector CT imaging of the chest, abdomen and pelvis was performed following the standard protocol during bolus administration of intravenous contrast. CONTRAST:  70mL OMNIPAQUE IOHEXOL 300 MG/ML  SOLN COMPARISON:  CT 05/04/2018  FINDINGS: CT CHEST FINDINGS Cardiovascular: No significant vascular findings. Normal heart size. No pericardial effusion.  Mediastinum/Nodes: Post RIGHT mastectomy. No axillary adenopathy. Enlarged LEFT supraclavicular lymph node measures 14 mm diameter (image 4/2). Enlarged precarinal lymph node measures 12 mm short axis. Large subcarinal nodes measure up to 19 mm short axis. These nodes have developed in short interval and were not present on CT 05/04/2018 Lungs/Pleura: multiple small bilateral pulmonary nodules. There is interlobular septal thickening LEFT and RIGHT lung. The nodules are too numerous to count and new from comparison exam. Musculoskeletal: No aggressive osseous lesion. CT ABDOMEN AND PELVIS FINDINGS Hepatobiliary: \large irregular lesion in the posterior RIGHT hepatic lobe measures 7.3 by 5.9 cm is new from comparison exam. Bulky periportal adenopathy with lymph nodes measuring up to 2.7 cm short axis. Pancreas: Pancreas is normal. No ductal dilatation. No pancreatic inflammation. Spleen: Normal spleen Adrenals/urinary tract: Adrenal glands and kidneys are normal. The ureters and bladder normal. Stomach/Bowel: Stomach, small bowel, appendix, and cecum are normal. The colon and rectosigmoid colon are normal. Vascular/Lymphatic: Abdominal aorta normal caliber. There is extensive upper abdominal retroperitoneal adenopathy and retrocrural adenopathy. Example lymph node posterior to the IVC at the level the renal veins measures 17 mm short axis. Reproductive: Post hysterectomy no adnexal abnormality Other: No peritoneal metastasis. Musculoskeletal: No aggressive osseous lesion degenerate change of the lower lumbar spine IMPRESSION: Chest Impression: 1. New extensive mediastinal and supraclavicular adenopathy consistent with metastatic disease. 2. New bilateral pulmonary nodules air and interlobular septal thickening suggest metastatic pulmonary nodules and lymphangitic spread of carcinoma. 3. Post RIGHT mastectomy anatomy. Abdomen / Pelvis Impression: 1. Large irregular lesion in the posterior RIGHT hepatic lobe consistent  metastatic leison. 2. Bulky periportal, retroperitoneal and retrocrural lymphadenopathy in the abdomen. Electronically Signed   By: Suzy Bouchard M.D.   On: 09/28/2018 16:54   Mr Thoracic Spine Wo Contrast  Result Date: 09/27/2018 CLINICAL DATA:  Suspected bone destruction on prior radiograph EXAM: MRI THORACIC SPINE WITHOUT CONTRAST TECHNIQUE: Multiplanar, multisequence MR imaging of the thoracic spine was performed. No intravenous contrast was administered. COMPARISON:  Thoracic spine radiograph 09/27/2018 Chest CT 05/04/2018 FINDINGS: Alignment:  Normal Vertebrae: There is heterogeneous marrow signal throughout the thoracic spine. There are numerous low T1 weighted signal lesions with associated edema on the inversion recovery images. The largest of these is at T7, but most of the thoracic levels are involved. Cord:  Normal signal and morphology. Paraspinal and other soft tissues: There are numerous para-aortic retroperitoneal lymph nodes. Disc levels: No spinal canal stenosis. IMPRESSION: 1. Diffusely abnormal bone marrow signal throughout the thoracic spine, which may be secondary to lymphomatous infiltration of bone marrow. Postcontrast imaging may be helpful to better define the extent. 2. No pathologic fracture. 3. Numerous enlarged retroperitoneal lymph nodes. CT or PET CT is recommended to better characterize extent. Electronically Signed   By: Ulyses Jarred M.D.   On: 09/27/2018 23:58   Ct Abdomen Pelvis W Contrast  Result Date: 09/28/2018 CLINICAL DATA:  Number remote history of breast cancer EXAM: CT ABDOMEN AND PELVIS WITH CONTRAST TECHNIQUE: Multidetector CT imaging of the abdomen and pelvis was performed using the standard protocol following bolus administration of intravenous contrast. CONTRAST:  62mL OMNIPAQUE IOHEXOL 300 MG/ML  SOLN COMPARISON:  CT 05/04/2018 FINDINGS: CT CHEST FINDINGS Cardiovascular: No significant vascular findings. Normal heart size. No pericardial effusion.  Mediastinum/Nodes: Post RIGHT mastectomy. No axillary adenopathy. Enlarged LEFT supraclavicular lymph node measures 14 mm diameter (image 4/2). Enlarged precarinal lymph node measures 12  mm short axis. Large subcarinal nodes measure up to 19 mm short axis. These nodes have developed in short interval and were not present on CT 05/04/2018 Lungs/Pleura: multiple small bilateral pulmonary nodules. There is interlobular septal thickening LEFT and RIGHT lung. The nodules are too numerous to count and new from comparison exam. Musculoskeletal: No aggressive osseous lesion. CT ABDOMEN AND PELVIS FINDINGS Hepatobiliary: \large irregular lesion in the posterior RIGHT hepatic lobe measures 7.3 by 5.9 cm is new from comparison exam. Bulky periportal adenopathy with lymph nodes measuring up to 2.7 cm short axis. Pancreas: Pancreas is normal. No ductal dilatation. No pancreatic inflammation. Spleen: Normal spleen Adrenals/urinary tract: Adrenal glands and kidneys are normal. The ureters and bladder normal. Stomach/Bowel: Stomach, small bowel, appendix, and cecum are normal. The colon and rectosigmoid colon are normal. Vascular/Lymphatic: Abdominal aorta normal caliber. There is extensive upper abdominal retroperitoneal adenopathy and retrocrural adenopathy. Example lymph node posterior to the IVC at the level the renal veins measures 17 mm short axis. Reproductive: Post hysterectomy no adnexal abnormality Other: No peritoneal metastasis. Musculoskeletal: No aggressive osseous lesion degenerate change of the lower lumbar spine IMPRESSION: Chest Impression: 1. New extensive mediastinal and supraclavicular adenopathy consistent with metastatic disease. 2. New bilateral pulmonary nodules air and interlobular septal thickening suggest metastatic pulmonary nodules and lymphangitic spread of carcinoma. 3. Post RIGHT mastectomy anatomy. Abdomen / Pelvis Impression: 1. Large irregular lesion in the posterior RIGHT hepatic lobe consistent  metastatic leison. 2. Bulky periportal, retroperitoneal and retrocrural lymphadenopathy in the abdomen. Electronically Signed   By: Suzy Bouchard M.D.   On: 09/28/2018 17:02   Dg Chest Portable 1 View  Result Date: 09/27/2018 CLINICAL DATA:  Weakness and fall.  Tachycardia. EXAM: PORTABLE CHEST 1 VIEW COMPARISON:  07/02/2013 radiographs. 09/15/2018 chest radiograph report, but images are not available to view. FINDINGS: The cardiomediastinal silhouette is unremarkable. Interstitial prominence bilaterally noted. Elevation of the RIGHT hemidiaphragm and surgical clips in the RIGHT axillary region again identified. No pleural effusion, pneumothorax or definite airspace disease. No acute bony abnormalities are identified. IMPRESSION: Mild interstitial prominence of uncertain chronicity. Infection or mild edema are considerations if acute. No other significant abnormalities. Electronically Signed   By: Margarette Canada M.D.   On: 09/27/2018 14:01   [place] Core Biopsy (lymph Nodes)  Result Date: 09/29/2018 INDICATION: History of marginal zone lymphoma involving the left orbit, now with findings worrisome for lymphomatous recurrence. Please perform ultrasound-guided biopsy of left supraclavicular lymph node for tissue diagnostic purposes. EXAM: ULTRASOUND-GUIDED BIOPSY LEFT SUPRACLAVICULAR LYMPH NODE COMPARISON:  CT the chest, abdomen and pelvis-10/08/2018 MEDICATIONS: None ANESTHESIA/SEDATION: Moderate (conscious) sedation was employed during this procedure. A total of Versed 1 mg and Fentanyl 50 mcg was administered intravenously. Moderate Sedation Time: 16 minutes. The patient's level of consciousness and vital signs were monitored continuously by radiology nursing throughout the procedure under my direct supervision. COMPLICATIONS: None immediate. TECHNIQUE: Informed written consent was obtained from the patient after a discussion of the risks, benefits and alternatives to treatment. Questions regarding the procedure  were encouraged and answered. Initial ultrasound scanning demonstrated enlarged at least 2.2 x 3.2 x 1.0 cm left supraclavicular lymph node correlating (images 2-10) with the dominant left supraclavicular lymph node seen on preceding chest CT (image 5, series 2). An ultrasound image was saved for documentation purposes. The procedure was planned. A timeout was performed prior to the initiation of the procedure. The operative was prepped and draped in the usual sterile fashion, and a sterile drape was applied covering  the operative field. A timeout was performed prior to the initiation of the procedure. Local anesthesia was provided with 1% lidocaine with epinephrine. Under direct ultrasound guidance, an 18 gauge core needle device was utilized to obtain to obtain 7 core needle biopsies of the dominant indeterminate left supraclavicular lymph node. The samples were placed in saline and submitted to pathology. The needle was removed and hemostasis was achieved with manual compression. Post procedure scan was negative for significant hematoma. A dressing was placed. The patient tolerated the procedure well without immediate postprocedural complication. IMPRESSION: Technically successful ultrasound guided biopsy of dominant indeterminate left supraclavicular lymph node. Electronically Signed   By: Sandi Mariscal M.D.   On: 09/29/2018 13:43    PERFORMANCE STATUS (ECOG) : 3 - Symptomatic, >50% confined to bed  Review of Systems As noted above. Otherwise, a complete review of systems is negative.  Physical Exam General: NAD, frail appearing, thin Cardiovascular: regular rate and rhythm Pulmonary: clear ant fields, on O2 Abdomen: soft, nontender, + bowel sounds GU: no suprapubic tenderness Extremities: no edema, no joint deformities Skin: no rashes Neurological: Weakness, some confusion, alert follows commands  IMPRESSION: Patient comfortable appearing in bed.  No family present.  Treatment options pending  pathology results.  Daughter does not feel patient would likely want to pursue treatment.  However from more discussions regarding goals will clearly need to occur once biopsy results have returned.  Symptomatically, patient reports feeling relatively comfortable.  She does endorse a dry cough that is irritating.  Will order cough suppressant.  PLAN: Continue supportive care DNR Benzonatate 100 mg twice daily as needed for cough We will continue conversations regarding goals once biopsy results are known and oncology has discussed treatment options   Time Total: 15 minutes  Visit consisted of counseling and education dealing with the complex and emotionally intense issues of symptom management and palliative care in the setting of serious and potentially life-threatening illness.Greater than 50%  of this time was spent counseling and coordinating care related to the above assessment and plan.  Signed by: Altha Harm, PhD, DNP, NP-C, [facility] [phone] (Work Cell)

## 2018-09-30 NOTE — Consult Note (Signed)
ANTICOAGULATION CONSULT NOTE - Initial Consult  Pharmacy Consult for Heparin Indication: atrial fibrillation  Allergies  Allergen Reactions  . Neosporin [Neomycin-Bacitracin Zn-Polymyx] Dermatitis  . Sulfa Antibiotics Itching and Rash  . Oxycodone-Acetaminophen Itching    Patient Measurements: Height: 5\' 5"  (165.1 cm) Weight: 153 lb 4.8 oz (69.5 kg) IBW/kg (Calculated) : 57 Heparin Dosing Weight: 68   Vital Signs: Temp: 98.3 F (36.8 C) (11/07 0751) Temp Source: Oral (11/07 0751) BP: 112/61 (11/07 0751) Pulse Rate: 84 (11/07 0751)  Labs: Recent Labs    09/27/18 1337 09/27/18 1622 09/27/18 2129 09/28/18 0356  09/28/18 0952  09/29/18 0013 09/29/18 0456 09/29/18 0811 09/29/18 1341 09/30/18 0252 09/30/18 1106  HGB 13.9  --   --  11.7*  --   --   --   --  12.2  --   --  11.0*  --   HCT 44.9  --   --  36.4  --   --   --   --  39.1  --   --  35.2*  --   PLT 177  --   --  149*  --   --   --   --  156  --   --  127*  --   APTT  --  <24*  --   --    < >  --    < > 85* 107*  --  26 68*  --   LABPROT  --  15.2  --   --   --   --   --   --   --  14.1  --   --   --   INR  --  1.21  --   --   --   --   --   --   --  1.10  --   --   --   HEPARINUNFRC  --  1.74*  --   --    < >  --   --   --  1.54*  --   --  0.67 0.22*  CREATININE 1.65*  --   --  1.23*  --   --   --  0.93  --   --   --   --   --   TROPONINI 0.32*  --  0.82* 0.81*  --  0.63*  --   --   --   --   --   --   --    < > = values in this interval not displayed.    Estimated Creatinine Clearance: 45.6 mL/min (by C-G formula based on SCr of 0.93 mg/dL).   Medical History: Past Medical History:  Diagnosis Date  . Allergy   . Arrhythmia   . Arthritis   . Atrial fibrillation ([place])   . Breast cancer ([place]) 2006   rt mastectomy  . Chronic kidney disease    chronic uti, kidney stones  . GERD (gastroesophageal reflux disease)   . Glaucoma   . HLD (hyperlipidemia)   . Hypertension   . Marginal zone lymphoma ([place])     left orbit  . Personal history of chemotherapy   . Personal history of radiation therapy   . 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  (last dose 11/4 @ 1000) HL elevated at baseline  11/7 patient is having nosebleed, Heparin stopped.  Goal of Therapy:  Heparin level 0.3-0.7 units/ml Monitor platelets by anticoagulation protocol: Yes   Plan:  Will follow up to see if Heparin will be restarted.  Paulina Fusi, PharmD, BCPS 09/30/2018 12:17 PM

## 2018-10-01 DIAGNOSIS — F411 Generalized anxiety disorder: Secondary | ICD-10-CM

## 2018-10-01 DIAGNOSIS — R0600 Dyspnea, unspecified: Secondary | ICD-10-CM

## 2018-10-01 DIAGNOSIS — Z515 Encounter for palliative care: Secondary | ICD-10-CM

## 2018-10-01 DIAGNOSIS — C884 Extranodal marginal zone B-cell lymphoma of mucosa-associated lymphoid tissue [MALT-lymphoma]: Secondary | ICD-10-CM

## 2018-10-01 DIAGNOSIS — R222 Localized swelling, mass and lump, trunk: Secondary | ICD-10-CM

## 2018-10-01 LAB — CBC
HCT: 35.1 % — ABNORMAL LOW (ref 36.0–46.0)
Hemoglobin: 10.9 g/dL — ABNORMAL LOW (ref 12.0–15.0)
MCH: 30.1 pg (ref 26.0–34.0)
MCHC: 31.1 g/dL (ref 30.0–36.0)
MCV: 97 fL (ref 80.0–100.0)
NRBC: 0 % (ref 0.0–0.2)
PLATELETS: 146 10*3/uL — AB (ref 150–400)
RBC: 3.62 MIL/uL — ABNORMAL LOW (ref 3.87–5.11)
RDW: 13.2 % (ref 11.5–15.5)
WBC: 12.9 10*3/uL — ABNORMAL HIGH (ref 4.0–10.5)

## 2018-10-01 LAB — BASIC METABOLIC PANEL
ANION GAP: 9 (ref 5–15)
BUN: 29 mg/dL — ABNORMAL HIGH (ref 8–23)
CALCIUM: 10.2 mg/dL (ref 8.9–10.3)
CO2: 23 mmol/L (ref 22–32)
Chloride: 110 mmol/L (ref 98–111)
Creatinine, Ser: 0.95 mg/dL (ref 0.44–1.00)
GFR, EST NON AFRICAN AMERICAN: 54 mL/min — AB (ref >60)
Glucose, Bld: 109 mg/dL — ABNORMAL HIGH (ref 70–99)
Potassium: 4.1 mmol/L (ref 3.5–5.1)
Sodium: 142 mmol/L (ref 135–145)

## 2018-10-01 LAB — HEPARIN LEVEL (UNFRACTIONATED): Heparin Unfractionated: 0.68 IU/mL (ref 0.30–0.70)

## 2018-10-01 MED ORDER — LORAZEPAM 2 MG/ML IJ SOLN: 0.5000 mg | INTRAMUSCULAR | Status: DC | PRN

## 2018-10-01 MED ORDER — MORPHINE SULFATE (PF) 2 MG/ML IV SOLN
1.0000 mg | INTRAVENOUS | Status: DC | PRN
  Administered 2018-10-01: 1 mg via INTRAVENOUS
  Filled 2018-10-01: qty 1

## 2018-10-01 MED ORDER — BENZONATATE 100 MG PO CAPS: 100.0000 mg | ORAL_CAPSULE | Freq: Two times a day (BID) | ORAL | 0 refills | Status: AC | PRN

## 2018-10-01 MED ORDER — MORPHINE SULFATE (PF) 2 MG/ML IV SOLN
1.0000 mg | INTRAVENOUS | Status: DC | PRN
  Administered 2018-10-01 (×2): 1 mg via INTRAVENOUS
  Filled 2018-10-01 (×2): qty 1

## 2018-10-01 MED ORDER — LORAZEPAM 2 MG/ML IJ SOLN: 0.5000 mg | INTRAMUSCULAR | 0 refills | Status: AC | PRN

## 2018-10-01 MED ORDER — GLYCOPYRROLATE 0.2 MG/ML IJ SOLN: 0.2000 mg | INTRAMUSCULAR | Status: AC | PRN

## 2018-10-01 MED ORDER — MORPHINE SULFATE (PF) 2 MG/ML IV SOLN: 1.0000 mg | INTRAVENOUS | 0 refills | Status: AC | PRN

## 2018-10-01 MED ORDER — GLYCOPYRROLATE 0.2 MG/ML IJ SOLN
0.2000 mg | INTRAMUSCULAR | Status: DC | PRN
  Filled 2018-10-01: qty 1

## 2018-10-01 MED ORDER — ZOLPIDEM TARTRATE 5 MG PO TABS: 5.0000 mg | ORAL_TABLET | Freq: Every evening | ORAL | 0 refills | Status: AC | PRN

## 2018-10-01 NOTE — Care Management (Signed)
Palliative paged; daughter wants to speak to someone about transfer to hospice home.  Daughter is crying and asking to speak to someone now. Mel Almond who is the CSW has also been notified.

## 2018-10-01 NOTE — Plan of Care (Signed)
  Problem: Nutrition: Goal: Adequate nutrition will be maintained Outcome: Not Progressing   

## 2018-10-01 NOTE — Clinical Social Work Note (Signed)
Clinical Social Work Assessment  Patient Details  Name: Sandy Hickman MRN: [id number] Date of Birth: May 10, 1934  Date of referral:  09/26/2018               Reason for consult:  End of Life/Hospice                Permission sought to share information with:  Chartered certified accountant granted to share information::  Yes, Verbal Permission Granted  Name::        Agency::     Relationship::     Contact Information:     Housing/Transportation Living arrangements for the past 2 months:  Single Family Home Source of Information:  Adult Children Patient Interpreter Needed:  None Criminal Activity/Legal Involvement Pertinent to Current Situation/Hospitalization:  No - Comment as needed Significant Relationships:  Adult Children Lives with:  Self Do you feel safe going back to the place where you live?  Yes Need for family participation in patient care:  Yes (Comment)  Care giving concerns: Patient lives in [place] and has 2 adult children, daughter Sandy Hickman [phone] and son Sandy Hickman.    Social Worker assessment / plan: Holiday representative (CSW) received hospice facility placement consult from palliative care. CSW met with patient and her daughter Sandy Hickman was at bedside. Patient did not participate in assessment. Per daughter she lives in [place] and her brother lives in [place]. Per daughter her brother is also battling with cancer. Per daughter patient does not have a HPOA of FPOA and asked if she can complete paper work while at [facility]. CSW explained that patient's mental status may prohibit her from signing paper work at this time. CSW explained that the medical decisions will automatically go to the adult children if patient does not have a spouse. Per Sandy Hickman patient does not have spouse. Sandy Hickman reported that she and her brother are agreeable to send patient to the [facility] in [place]. CSW explained that a referral to [facility] will be made and Santiago Glad  the [facility] liaison will reach out to daughter. CSW made referral to [employer]. CSW will continue to follow and assist as needed.   Employment status:  Retired Nurse, adult PT Recommendations:  Not assessed at this time Information / Referral to community resources:  Other (Comment Required)(Hospice Faiclity placement )  Patient/Family's Response to care:  Patient's daughter is agreeable for patient to D/C to [facility] facility.   Patient/Family's Understanding of and Emotional Response to Diagnosis, Current Treatment, and Prognosis:  Patient's daughter was very pleasant and thanked CSW for assistance.   Emotional Assessment Appearance:  Appears stated age Attitude/Demeanor/Rapport:  Unable to Assess Affect (typically observed):  Unable to Assess Orientation:  Oriented to Self, Fluctuating Orientation (Suspected and/or reported Sundowners) Alcohol / Substance use:  Not Applicable Psych involvement (Current and /or in the community):  No (Comment)  Discharge Needs  Concerns to be addressed:  Discharge Planning Concerns Readmission within the last 30 days:  No Current discharge risk:  Chronically ill, Cognitively Impaired, Dependent with Mobility, Terminally ill Barriers to Discharge:  Continued Medical Work up   [employer], Veronia Beets, LCSW 09/27/2018, 9:15 AM

## 2018-10-01 NOTE — Progress Notes (Signed)
New hospice home referral received from [facility] following a  Palliative Medicine consult. Patient is an 84 year old woman with multiple medical problems including marginal zone lymphoma status post radiation, A. fib on Eliquis, history of breast cancer status post right mastectomy, and CKD, who was admitted on 09/27/2018 with progressive weakness over the past few weeks. In the ED she was found to have a fib with RVR, acute CHF and hypercalcemia. CT of chest/abdomen/pelvis reveal extensive new mediastinal and supraclavicular adenopathy, bilateral pulmonary nodules with probable lymphangitic spread, a large hepatic mass, and bulky retroperitoneal lymphadenopathy suggestive of widely metastatic disease. These changes have occurred since last CT on 05/04/18. Patient has continued with a decline in functional status in the past 2 days. Patient and her daughter have chose to focus on comfort with transfer to the hospice home. Writer met in the room with patient and her daughter Jeani Hawking to initiate education regarding hospice services, philosophy and team approach to care with understanding voiced. Consents were signed by Jeani Hawking. Patient was alert and quietly interactive, increased work of breathing noted, however patient denied dyspnea. IV morphine has been ordered and patient was given a 1 mg dose earlier this morning. Patient information faxed to referral, report called to the hospice home, EMS notified for transport, Hospital care team updated. Thank you for the opportunity to be involved in the care of this patient and her family. Flo Shanks RN, BSN, [place] [facility] liaison [phone]

## 2018-10-01 NOTE — Progress Notes (Signed)
Patient going to hospice facility via EMS.

## 2018-10-01 NOTE — Discharge Summary (Signed)
[place] [facility] [place] NAME: Sandy Hickman    MR#:  [id number]  DATE OF BIRTH:  Nov 18, 1934  DATE OF ADMISSION:  09/27/2018 ADMITTING PHYSICIAN: Gladstone Lighter, MD  DATE OF DISCHARGE: 09/28/2018  PRIMARY CARE PHYSICIAN: Ezequiel Kayser, MD    ADMISSION DIAGNOSIS:  Back pain [M54.9] Acute renal insufficiency [N28.9] Demand ischemia ([place]) [I24.8] Elevated troponin [R79.89] Atrial fibrillation, rapid ([place]) [I48.91] Fall, initial encounter [W19.XXXA]  DISCHARGE DIAGNOSIS:  Active Problems:   Atrial fibrillation, rapid [facility])   Liver mass   Terminal care   Palliative care by specialist   Dyspnea   Anxiety state   Supraclavicular mass   SECONDARY DIAGNOSIS:   Past Medical History:  Diagnosis Date  . Allergy   . Arrhythmia   . Arthritis   . Atrial fibrillation ([place])   . Breast cancer ([place]) 2006   rt mastectomy  . Chronic kidney disease    chronic uti, kidney stones  . GERD (gastroesophageal reflux disease)   . Glaucoma   . HLD (hyperlipidemia)   . Hypertension   . Marginal zone lymphoma ([place])    left orbit  . Personal history of chemotherapy   . Personal history of radiation therapy   . 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 Fib, hx of Neuropathy, Glaucoma, who presented to the hospital who presented to the hospital due to weakness and falls and noted to be in a. Fib w/ RVR.    1.  Metastatic Cancer/unknown primary-patient had been having back pain with recurrent falls which has progressively gotten worse.  She had a MRI of the thoracic spine done which shows diffusely abnormal bone marrow through the entire thoracic spine suggestive of lymphomatous infiltration.   - Oncology consult obtained (Dr. Grayland Ormond) who recommended getting a CT scan of the chest abdomen pelvis which showed mediastinal and supraclavicular  lymphadenopathy along with a large liver mass and also retroperitoneal lymphadenopathy.   - s/p US guided liver biopsy yesterday but path pending.  Palliative Care consulted and patient's prognosis is quite poor and pt's functional status is poor and she is not a candidate for treatment presently.  - after discussions with Palliative care with pt's daughter she has opted for Hospice services and pt is now being discharged to [facility].  - cont. PRN IV Morphine, Ativan and Glycopyrrolate.    2. A. Fib w/ RVR - improved with Cardizem gtt and was on Toprol but now off meds as pt. Is COMFORT CARE and being discharged to [facility].   3.  Hypercalcemia- improved and resolved with Zometa, IV fluids.    4.  Glaucoma- continue Latanoprost, Timolol eye drops.   5. Neuropathy - cont. Neurontin.   Discharged to [facility] today.   DISCHARGE CONDITIONS:   Stable.   CONSULTS OBTAINED:  Treatment Team:  Corey Skains, MD Lloyd Huger, MD  DRUG ALLERGIES:   Allergies  Allergen Reactions  . Neosporin [Neomycin-Bacitracin Zn-Polymyx] Dermatitis  . Sulfa Antibiotics Itching and Rash  . Oxycodone-Acetaminophen Itching    DISCHARGE MEDICATIONS:   Allergies as of 10/08/2018      Reactions   Neosporin [neomycin-bacitracin Zn-polymyx] Dermatitis   Sulfa Antibiotics Itching, Rash   Oxycodone-acetaminophen Itching      Medication List    STOP taking these medications   acidophilus Caps capsule  ascorbic acid 500 MG tablet Commonly known as:  VITAMIN C   CENTRUM SILVER PO   Cranberry 500 MG Caps   ELIQUIS 5 MG Tabs tablet Generic drug:  apixaban   ezetimibe 10 MG tablet Commonly known as:  ZETIA   ferrous sulfate 325 (65 FE) MG tablet   Fish Oil 500 MG Caps   gemfibrozil 600 MG tablet Commonly known as:  LOPID   loratadine 10 MG tablet Commonly known as:  CLARITIN   losartan 100 MG tablet Commonly known as:  COZAAR   Melatonin 10 MG Tabs    metoprolol succinate 25 MG 24 hr tablet Commonly known as:  TOPROL-XL   mirtazapine 15 MG tablet Commonly known as:  REMERON   omeprazole 20 MG capsule Commonly known as:  PRILOSEC   predniSONE 10 MG (21) Tbpk tablet Commonly known as:  STERAPRED UNI-PAK 21 TAB   traMADol 50 MG tablet Commonly known as:  ULTRAM   vitamin B-12 500 MCG tablet Commonly known as:  CYANOCOBALAMIN     TAKE these medications   benzonatate 100 MG capsule Commonly known as:  TESSALON Take 1 capsule (100 mg total) by mouth 2 (two) times daily as needed for cough.   gabapentin 300 MG capsule Commonly known as:  NEURONTIN Take 300 mg by mouth at bedtime.   glycopyrrolate 0.2 MG/ML injection Commonly known as:  ROBINUL Inject 1 mL (0.2 mg total) into the vein every 4 (four) hours as needed (secretions).   latanoprost 0.005 % ophthalmic solution Commonly known as:  XALATAN Place 1 drop into both eyes at bedtime.   LORazepam 2 MG/ML injection Commonly known as:  ATIVAN Inject 0.25 mLs (0.5 mg total) into the vein every 4 (four) hours as needed for anxiety.   morphine 2 MG/ML injection Inject 0.5-1 mLs (1-2 mg total) into the vein every hour as needed (dyspnea/tachypnea).   timolol 0.5 % ophthalmic solution Commonly known as:  TIMOPTIC Place 1 drop into both eyes daily.   zolpidem 5 MG tablet Commonly known as:  AMBIEN Take 1 tablet (5 mg total) by mouth at bedtime as needed for sleep.         DISCHARGE INSTRUCTIONS:   DIET:  Regular diet  DISCHARGE CONDITION:  Fair  ACTIVITY:  Activity as tolerated  OXYGEN:  Home Oxygen: Yes.     Oxygen Delivery: 2 liters/min via Patient connected to nasal cannula oxygen  DISCHARGE LOCATION:  Hospice Home   If you experience worsening of your admission symptoms, develop shortness of breath, life threatening emergency, suicidal or homicidal thoughts you must seek medical attention immediately by calling 911 or calling your MD immediately   if symptoms less severe.  You Must read complete instructions/literature along with all the possible adverse reactions/side effects for all the Medicines you take and that have been prescribed to you. Take any new Medicines after you have completely understood and accpet all the possible adverse reactions/side effects.   Please note  You were cared for by a hospitalist during your hospital stay. If you have any questions about your discharge medications or the care you received while you were in the hospital after you are discharged, you can call the unit and asked to speak with the hospitalist on call if the hospitalist that took care of you is not available. Once you are discharged, your primary care physician will handle any further medical issues. Please note that NO REFILLS for any discharge medications will be authorized once you are discharged,  as it is imperative that you return to your primary care physician (or establish a relationship with a primary care physician if you do not have one) for your aftercare needs so that they can reassess your need for medications and monitor your lab values.     Today   No acute events overnight. PO intake remains poor.  Palliative Care input and help appreciated and will d/c pt. To Hospice Home today.   VITAL SIGNS:  Blood pressure 116/63, pulse 97, temperature 97.9 F (36.6 C), temperature source Oral, resp. rate 18, height 5\' 5"  (1.651 m), weight 70.2 kg, SpO2 95 %.  I/O:    Intake/Output Summary (Last 24 hours) at 10/06/2018 1209 Last data filed at 10/04/2018 0430 Gross per 24 hour  Intake 620.73 ml  Output 750 ml  Net -129.27 ml    PHYSICAL EXAMINATION:   GENERAL:  84 y.o.-year-old pale appearing patient lying in bed in no acute distress.  EYES: Pupils equal, round, reactive to light and accommodation. No scleral icterus. Extraocular muscles intact.  HEENT: Head atraumatic, normocephalic. Oropharynx and nasopharynx clear.  NECK:   Supple, no jugular venous distention. No thyroid enlargement, no tenderness.  LUNGS: Normal breath sounds bilaterally, no wheezing, rales, rhonchi. No use of accessory muscles of respiration.  CARDIOVASCULAR: S1, S2 normal. No murmurs, rubs, or gallops.  ABDOMEN: Soft, nontender, nondistended. Bowel sounds present. No organomegaly or mass.  EXTREMITIES: No cyanosis, clubbing or edema b/l.    NEUROLOGIC: Cranial nerves II through XII are intact. No focal Motor or sensory deficits b/l. Globally weak.   PSYCHIATRIC: The patient is alert and oriented x 2.  SKIN: No obvious rash, lesion, or ulcer.   DATA REVIEW:   CBC Recent Labs  Lab 09/25/2018 0724  WBC 12.9*  HGB 10.9*  HCT 35.1*  PLT 146*    Chemistries  Recent Labs  Lab 09/27/18 1337  10/19/2018 0724  NA 138   < > 142  K 4.6   < > 4.1  CL 104   < > 110  CO2 18*   < > 23  GLUCOSE 288*   < > 109*  BUN 47*   < > 29*  CREATININE 1.65*   < > 0.95  CALCIUM 12.9*   < > 10.2  AST 47*  --   --   ALT 43  --   --   ALKPHOS 256*  --   --   BILITOT 1.3*  --   --    < > = values in this interval not displayed.    Cardiac Enzymes Recent Labs  Lab 09/28/18 0952  TROPONINI 0.63*    Microbiology Results  Results for orders placed or performed during the hospital encounter of 09/27/18  CULTURE, BLOOD (ROUTINE X 2) w Reflex to ID Panel     Status: None (Preliminary result)   Collection Time: 09/27/18  8:44 PM  Result Value Ref Range Status   Specimen Description BLOOD RIGHT ANTECUBITAL  Final   Special Requests   Final    BOTTLES DRAWN AEROBIC AND ANAEROBIC Blood Culture results may not be optimal due to an inadequate volume of blood received in culture bottles   Culture   Final    NO GROWTH 4 DAYS Performed at [facility], [address] [place], [place] [address]    Report Status PENDING  Incomplete  CULTURE, BLOOD (ROUTINE X 2) w Reflex to ID Panel     Status: None (Preliminary result)   Collection  Time: 09/27/18   9:30 PM  Result Value Ref Range Status   Specimen Description BLOOD BLOOD LEFT HAND  Final   Special Requests   Final    BOTTLES DRAWN AEROBIC AND ANAEROBIC Blood Culture adequate volume   Culture   Final    NO GROWTH 4 DAYS Performed at [facility], [address] [place], [place] [address]    Report Status PENDING  Incomplete    RADIOLOGY:  [place] Core Biopsy (lymph Nodes)  Result Date: 09/29/2018 INDICATION: History of marginal zone lymphoma involving the left orbit, now with findings worrisome for lymphomatous recurrence. Please perform ultrasound-guided biopsy of left supraclavicular lymph node for tissue diagnostic purposes. EXAM: ULTRASOUND-GUIDED BIOPSY LEFT SUPRACLAVICULAR LYMPH NODE COMPARISON:  CT the chest, abdomen and pelvis-10/08/2018 MEDICATIONS: None ANESTHESIA/SEDATION: Moderate (conscious) sedation was employed during this procedure. A total of Versed 1 mg and Fentanyl 50 mcg was administered intravenously. Moderate Sedation Time: 16 minutes. The patient's level of consciousness and vital signs were monitored continuously by radiology nursing throughout the procedure under my direct supervision. COMPLICATIONS: None immediate. TECHNIQUE: Informed written consent was obtained from the patient after a discussion of the risks, benefits and alternatives to treatment. Questions regarding the procedure were encouraged and answered. Initial ultrasound scanning demonstrated enlarged at least 2.2 x 3.2 x 1.0 cm left supraclavicular lymph node correlating (images 2-10) with the dominant left supraclavicular lymph node seen on preceding chest CT (image 5, series 2). An ultrasound image was saved for documentation purposes. The procedure was planned. A timeout was performed prior to the initiation of the procedure. The operative was prepped and draped in the usual sterile fashion, and a sterile drape was applied covering the operative field. A timeout was performed prior to the initiation  of the procedure. Local anesthesia was provided with 1% lidocaine with epinephrine. Under direct ultrasound guidance, an 18 gauge core needle device was utilized to obtain to obtain 7 core needle biopsies of the dominant indeterminate left supraclavicular lymph node. The samples were placed in saline and submitted to pathology. The needle was removed and hemostasis was achieved with manual compression. Post procedure scan was negative for significant hematoma. A dressing was placed. The patient tolerated the procedure well without immediate postprocedural complication. IMPRESSION: Technically successful ultrasound guided biopsy of dominant indeterminate left supraclavicular lymph node. Electronically Signed   By: Sandi Mariscal M.D.   On: 09/29/2018 13:43      Management plans discussed with the patient, family and they are in agreement.  CODE STATUS:     Code Status Orders  (From admission, onward)         Start     Ordered   09/30/18 0854  Do not attempt resuscitation (DNR)  Continuous    Question Answer Comment  In the event of cardiac or respiratory ARREST Do not call a "code blue"   In the event of cardiac or respiratory ARREST Do not perform Intubation, CPR, defibrillation or ACLS   In the event of cardiac or respiratory ARREST Use medication by any route, position, wound care, and other measures to relive pain and suffering. May use oxygen, suction and manual treatment of airway obstruction as needed for comfort.      09/30/18 0853          TOTAL TIME TAKING CARE OF THIS PATIENT: 40 minutes.    Henreitta Leber M.D on 09/26/2018 at 12:09 PM  Between 7am to 6pm - Pager - [phone]  After 6pm go to [email or web address] -  password Airline pilot  [employer] [facility]  Office  [phone]  CC: Primary care physician; Ezequiel Kayser, MD

## 2018-10-01 NOTE — Progress Notes (Signed)
Per [facility] liaison she can accept patient today to the hospice facility. Clinical Education officer, museum (CSW) prepared EMS form. MD aware of above. Please reconsult if future social work needs arise. CSW signing off.   [employer], LCSW [phone]

## 2018-10-01 NOTE — Progress Notes (Signed)
Daily Progress Note   Patient Name: Sandy Hickman       Date: 09/26/2018 DOB: Oct 06, 1934  Age: 84 y.o. MRN#: [id number] Attending Physician: Henreitta Leber, MD Primary Care Physician: Ezequiel Kayser, MD Admit Date: 09/27/2018  Reason for Consultation/Follow-up: Establishing goals of care and Terminal Care  Subjective: Patient awake, alert, and smiling. Her preacher is at bedside which brings her joy. She is visibly short of breath and using accessory muscles. RN at bedside giving prn IV morphine.   F/u with patient and daughter Jeani Hawking). Answered questions and concerns regarding plan of care and shift to comfort measures. Plan is for transfer to hospice facility. Patient tells [place] she does not want to take any more pills. I discontinued multiple medications not aimed at comfort. Daughter understands she will not receive IVF at hospice facility. We discussed role of comfort and dignity at EOL. Educated on symptom management regimen to ensure comfort and relief from suffering. Daughter states "I just want her to be comfortable." Discussed comfort feeds and EOL expectations.   Therapeutic listening and emotional/spiritual support provided as patient/family share stories. PMT contact information given.   Length of Stay: 4  Current Medications: Scheduled Meds:  . gabapentin  300 mg Oral QHS  . latanoprost  1 drop Both Eyes QHS  . mirtazapine  15 mg Oral QHS  . timolol  1 drop Both Eyes Daily    Continuous Infusions:   PRN Meds: acetaminophen **OR** acetaminophen, benzonatate, glycopyrrolate, LORazepam, morphine injection, ondansetron **OR** ondansetron (ZOFRAN) IV, zolpidem  Physical Exam  Constitutional: She is oriented to person, place, and time. She appears ill.  HENT:  Head:  Normocephalic and atraumatic.  Pulmonary/Chest: Accessory muscle usage present. Tachypnea noted.  Dyspnea at rest. RN to give IV morphine.   Abdominal: There is no tenderness.  Neurological: She is alert and oriented to person, place, and time.  Skin: Skin is warm and dry. There is pallor.  Psychiatric: Her speech is normal and behavior is normal.  Nursing note and vitals reviewed.           Vital Signs: BP 116/63 (BP Location: Left Arm)   Pulse 97   Temp 97.9 F (36.6 C) (Oral)   Resp 18   Ht 5\' 5"  (1.651 m)   Wt 70.2 kg   SpO2 95%  BMI 25.75 kg/m  SpO2: SpO2: 95 % O2 Device: O2 Device: Nasal Cannula O2 Flow Rate: O2 Flow Rate (L/min): 2 L/min  Intake/output summary:   Intake/Output Summary (Last 24 hours) at 10/06/2018 1003 Last data filed at 10/18/2018 0430 Gross per 24 hour  Intake 620.73 ml  Output 750 ml  Net -129.27 ml   LBM: Last BM Date: 09/30/18 Baseline Weight: Weight: 68 kg Most recent weight: Weight: 70.2 kg       Palliative Assessment/Data: PPS 20%   Flowsheet Rows     Most Recent Value  Intake Tab  Referral Department  Oncology  Unit at Time of Referral  Med/Surg Unit  Palliative Care Primary Diagnosis  Cancer  Date Notified  09/29/18  Palliative Care Type  New Palliative care  Reason for referral  Clarify Goals of Care, End of Life Care Assistance  Date of Admission  09/27/18  Date first seen by Palliative Care  09/29/18  # of days Palliative referral response time  0 Day(s)  # of days IP prior to Palliative referral  2  Clinical Assessment  Psychosocial & Spiritual Assessment  Palliative Care Outcomes  Patient/Family meeting held?  Yes  Who was at the meeting?  daughter, patient  Palliative Care Outcomes  Clarified goals of care, Counseled regarding hospice, Provided psychosocial or spiritual support, Improved pain interventions, Improved non-pain symptom therapy, Provided end of life care assistance, Changed to focus on comfort,  Transitioned to hospice      Patient Active Problem List   Diagnosis Date Noted  . Liver mass   . Palliative care encounter   . A-fib ([place]) 09/27/2018  . 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 female with multiple medical problems including marginal zone lymphoma status post radiation, A. fib on Eliquis, history of breast cancer status post right mastectomy, and CKD, who was admitted on 09/27/2018 with progressive weakness over the past few weeks.  She was found to have A. fib with RVR, acute CHF, and hypercalcemia.  CT of chest/abdomen/pelvis reveal extensive new mediastinal and supraclavicular adenopathy, bilateral pulmonary nodules with probable lymphangitic spread, a large hepatic mass, and bulky retroperitoneal lymphadenopathy suggestive of widely metastatic disease.  Of note, this disease was not evident at the time of last CT on 05/04/2018.  Patient is status post biopsy of the left supraclavicular lymph node.  Pathology is pending.  Palliative care was asked to help establish goals of care.  Assessment: Hx of lymphoma Metastatic cancer with unknown primary Back pain Recurrent falls Hypercalcemia Atrial fibrillation RVR Neuropathy Dyspnea  Recommendations/Plan:  Shift to comfort measures only. Discontinued medications not aimed at comfort.   Comfort feeds per patient/family request.   Symptom management  Morphine 1-2mg  IV q1h prn pain/dyspnea/tachypnea  Ativan 0.5mg  IV q4h prn anxiety  Robinul 0.2mg  IV q4h prn secretions  SW following. Consult placed for residential hospice.   Goals of Care and Additional Recommendations:  Limitations on Scope of Treatment: Full Comfort Care  Code Status: DNR/DNI   Code Status  Orders  (From admission, onward)         Start     Ordered   09/30/18 0854  Do not attempt resuscitation (DNR)  Continuous     Question Answer Comment  In the event of cardiac or respiratory ARREST Do not call a "code blue"   In the event of cardiac or respiratory ARREST  Do not perform Intubation, CPR, defibrillation or ACLS   In the event of cardiac or respiratory ARREST Use medication by any route, position, wound care, and other measures to relive pain and suffering. May use oxygen, suction and manual treatment of airway obstruction as needed for comfort.      09/30/18 0853        Code Status History    Date Active Date Inactive Code Status Order ID Comments User Context   09/27/2018 1728 09/30/2018 0853 Full Code [id number]  Gladstone Lighter, MD Inpatient    Advance Directive Documentation     Most Recent Value  Type of Advance Directive  Healthcare Power of Attorney  Pre-existing out of facility DNR order (yellow form or pink MOST form)  -  "MOST" Form in Place?  -       Prognosis:   < 2 weeks: widespread metastatic cancer of unknown origin. Significant decline in functional and nutritional status.   Discharge Planning:  Hospice facility  Care plan was discussed with patient, daughter, RN, SW, hospice liaison, Merrily Pew Borders NP  Thank you for allowing the Palliative Medicine Team to assist in the care of this patient.   Time In: 0910 Time Out: 0950 Total Time 70min Prolonged Time Billed  no      Greater than 50%  of this time was spent counseling and coordinating care related to the above assessment and plan.  Ihor Dow, FNP-C Palliative Medicine Team  Phone: [phone] Fax: [phone]  Please contact Palliative Medicine Team phone at [phone] for questions and concerns.

## 2018-10-01 NOTE — Progress Notes (Signed)
PT Cancellation Note  Patient Details Name: Sandy Hickman MRN: [id number] DOB: May 09, 1934   Cancelled Treatment:    Reason Eval/Treat Not Completed: Other (comment)(PT to sign off, pallative care has discontinued PT orders. )  Lieutenant Diego PT, DPT 10:37 AM,10/22/2018 [phone]

## 2018-10-02 LAB — CULTURE, BLOOD (ROUTINE X 2)
CULTURE: NO GROWTH
Culture: NO GROWTH
Special Requests: ADEQUATE

## 2018-10-04 LAB — SURGICAL PATHOLOGY

## 2018-10-07 ENCOUNTER — Other Ambulatory Visit: Payer: Self-pay | Admitting: *Deleted

## 2018-10-07 NOTE — Patient Outreach (Signed)
[facility]) Care Management  10/07/2018  Sandy Hickman 1933-12-08 [id number]   EMMI-general discharge from [facility] 10/03/18 -Not on APL  RED ON EMMI ALERT Day # 4 Date: 10/06/18 1348 Red Alert Reason: got discharge papers? No    Insurance: [employer] medicare [facility] admissions x 1 ED visits x 0 in the last 6 months    Outreach attempt # 1 No answer at the home number. No message could be left No answer at the mobile number but [facility] RN CM left HIPAA compliant voicemail message along with CM's contact info.   Plan: [facility] RN CM sent an unsuccessful outreach letter and scheduled this patient for another call attempt within 4 business days   Gaylon Melchor L. Lavina Hamman, RN, BSN, [place] Coordinator Office number [phone] Mobile number [phone]  Main [facility] number [id number] Fax number [phone]

## 2018-10-08 ENCOUNTER — Ambulatory Visit: Payer: Self-pay | Admitting: *Deleted

## 2018-10-24 DEATH — deceased

## 2018-11-03 ENCOUNTER — Ambulatory Visit: Payer: Medicare HMO

## 2018-11-05 ENCOUNTER — Ambulatory Visit: Payer: Medicare HMO | Admitting: Oncology

## 2018-12-03 ENCOUNTER — Other Ambulatory Visit: Payer: Self-pay | Admitting: *Deleted

## 2018-12-03 NOTE — Patient Outreach (Signed)
[place] [facility]) Care Management  12/03/2018  Sandy Hickman January 02, 1934 [id number]   Case closure   Call attempts made on 10/07/18  Unsuccessful outreach letter sent on 10/07/18 without a response  Cm reviewed ED to indicate pt was sent to hospice from hospital    Plan [facility] RN CM will close case- consumer deceased  [name]. Lavina Hamman, RN, BSN, [place] Coordinator Office number [phone] Mobile number [phone]  Main [facility] number [phone] Fax number [phone]

## 2018-12-23 IMAGING — MR MR ORBITS WO/W CM
7 of 9 series · 29 of 48 positions shown · IV contrast (multihance)
Comparison: PET scan 07/23/2017

CLINICAL DATA: Recent excision of B-cell lymphoma of the left eye
lid.

EXAM:
MRI OF THE ORBITS WITHOUT AND WITH CONTRAST
TECHNIQUE: Multiplanar, multisequence MR imaging of the orbits was performed
both before and after the administration of intravenous contrast.
CONTRAST:  14mL MULTIHANCE GADOBENATE DIMEGLUMINE 529 MG/ML IV SOLN

[Series 3: T2 fat-sat · axial · 3.0mm · 0.31mm/px · z∈[-2,+56]mm · 3 of 19 slices shown (1 of 2)]
[im 1/19]
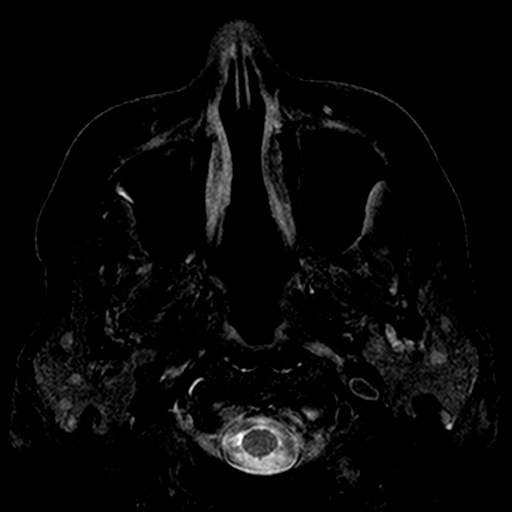
[im 10/19]
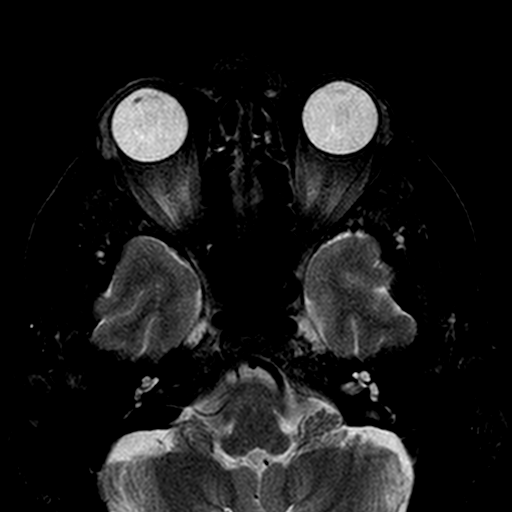
[im 19/19]
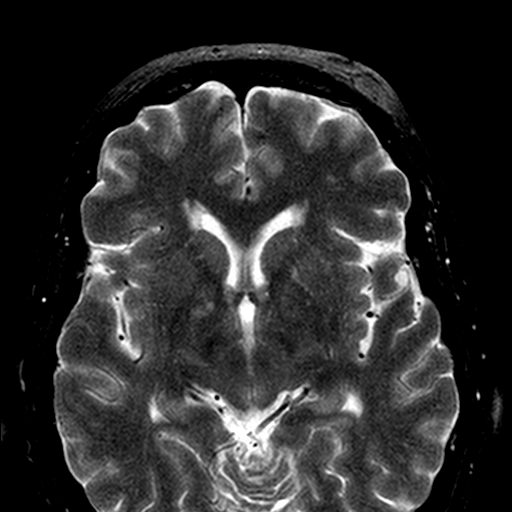

[Series 4: T2 fat-sat · coronal · 3.0mm · 0.31mm/px · 4 of 26 slices shown (2 of 2)]
[im 1/26]
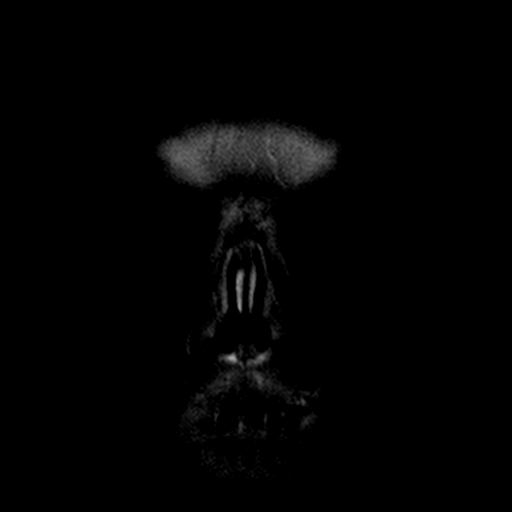
[im 9/26]
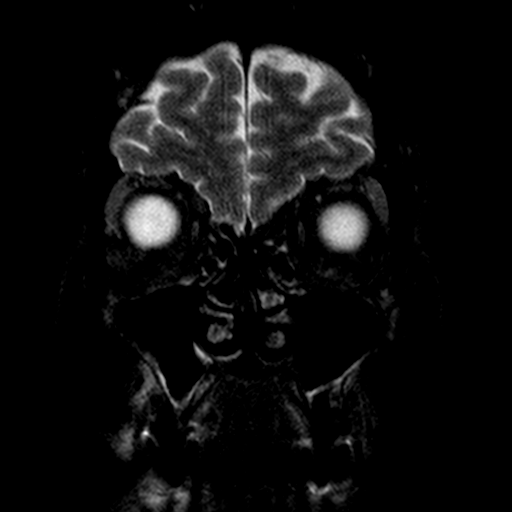
[im 17/26]
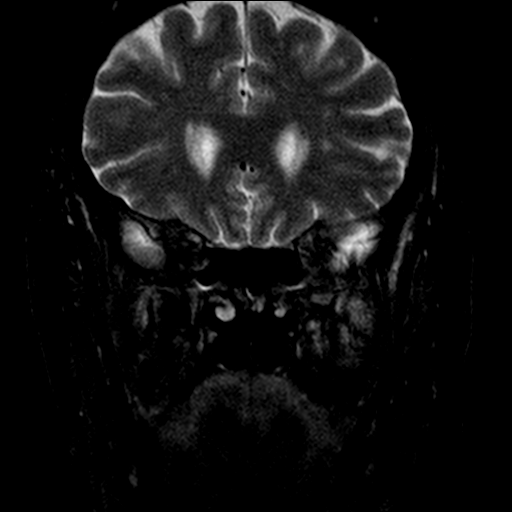
[im 26/26]
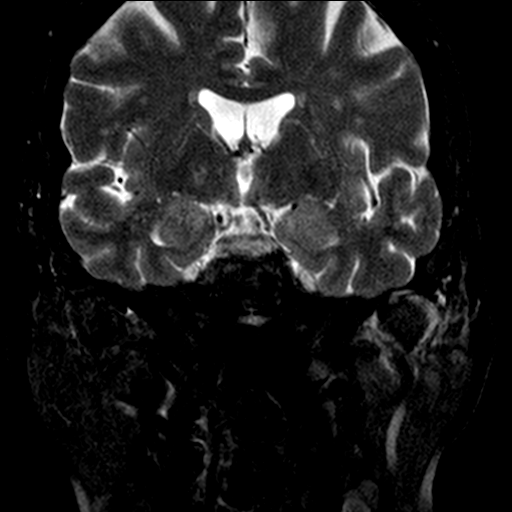

[Series 5: T1 · axial · non-contrast · 3.0mm · 0.62mm/px · z∈[-2,+56]mm · 3 of 19 slices shown (1 of 2)]
[im 1/19]
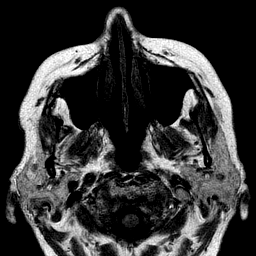
[im 10/19]
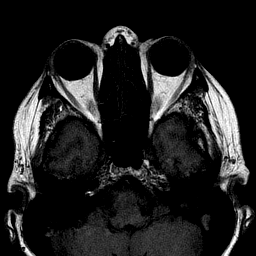
[im 19/19]
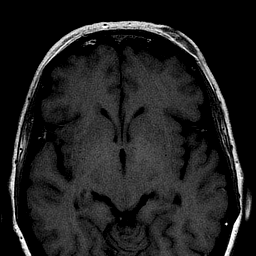

[Series 6: T1 · coronal · non-contrast · 3.0mm · 0.62mm/px · 4 of 26 slices shown (2 of 2)]
[im 1/26]
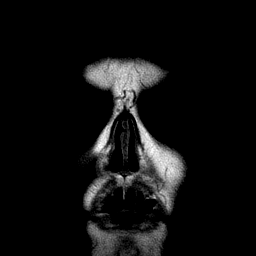
[im 9/26]
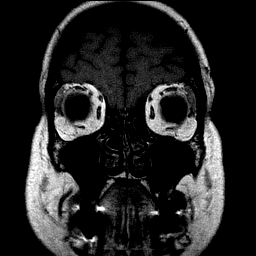
[im 17/26]
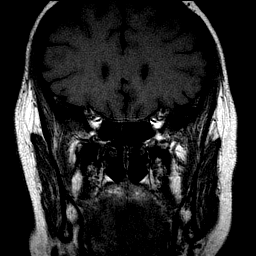
[im 26/26]
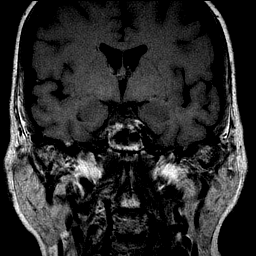

[Series 7: T1 fat-sat post-contrast · axial · 3.0mm · 0.62mm/px · z∈[-2,+56]mm · 3 of 19 slices shown (1 of 2)]
[im 1/19]
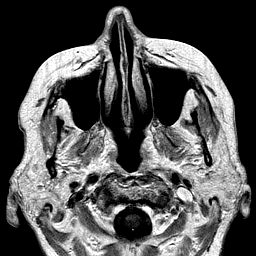
[im 10/19]
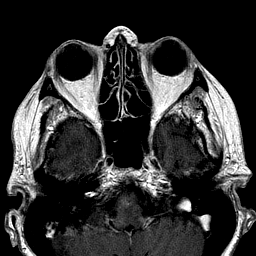
[im 19/19]
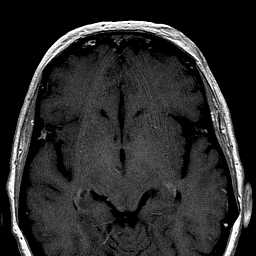

[Series 8: T1 fat-sat post-contrast · coronal · 3.0mm · 0.62mm/px · 1 of 26 slices shown (2 of 2)]
[im 1/26]
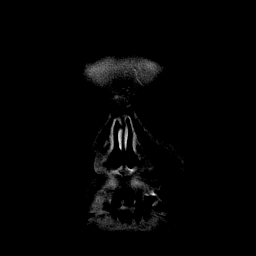

[Series 10: T1 post-contrast · axial · 1.0mm · 0.45mm/px · z∈[-46,+83]mm · 11 of 144 slices shown]
[im 7/144]
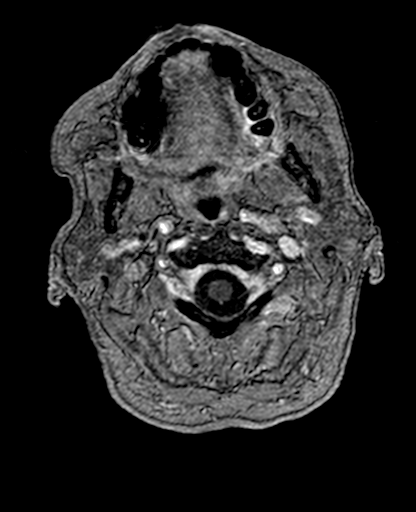
[im 21/144]
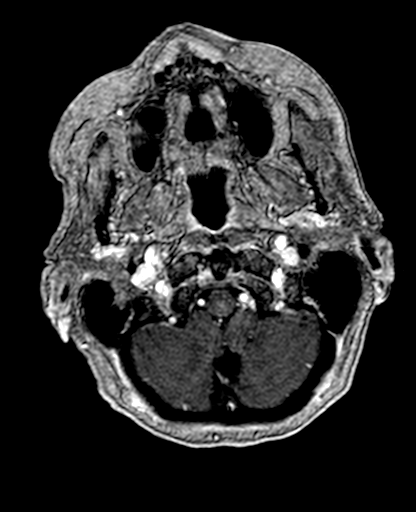
[im 28/144]
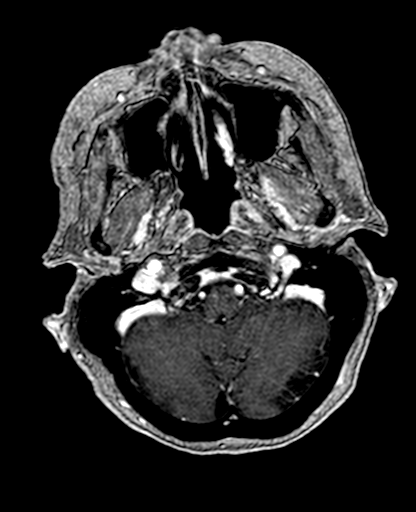
[im 41/144]
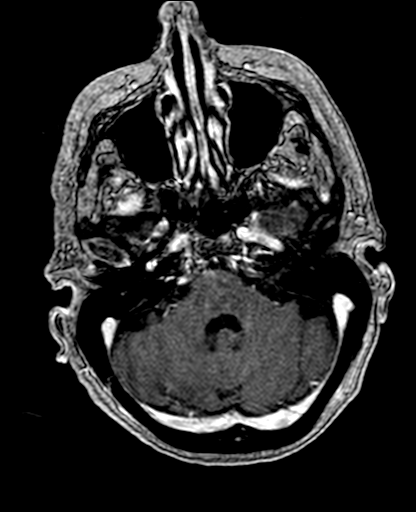
[im 62/144]
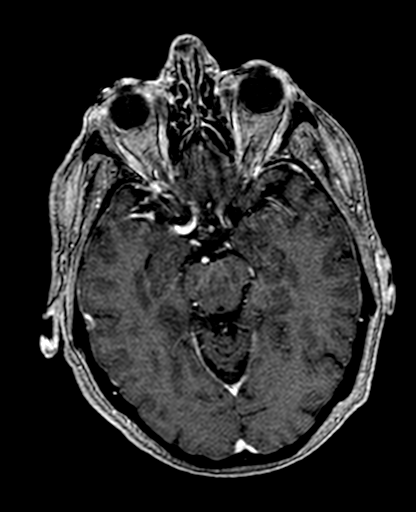
[im 75/144]
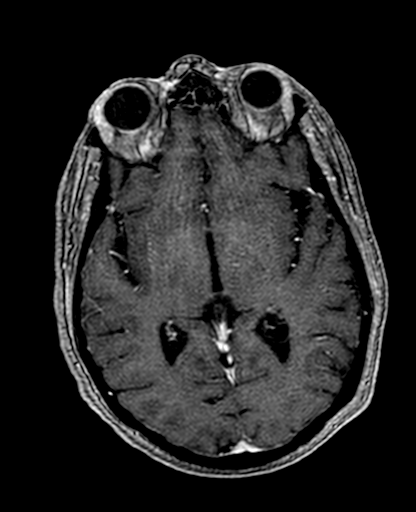
[im 82/144]
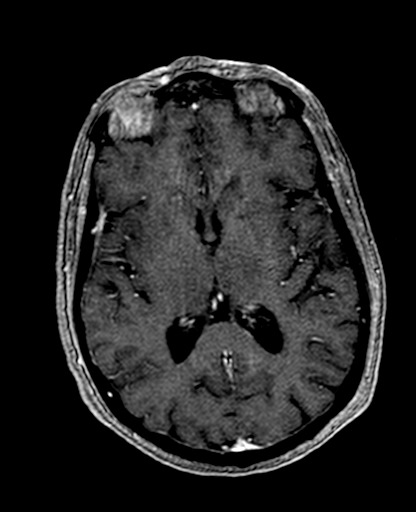
[im 103/144]
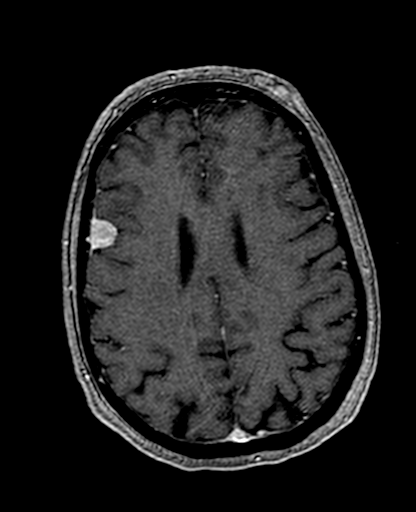
[im 116/144]
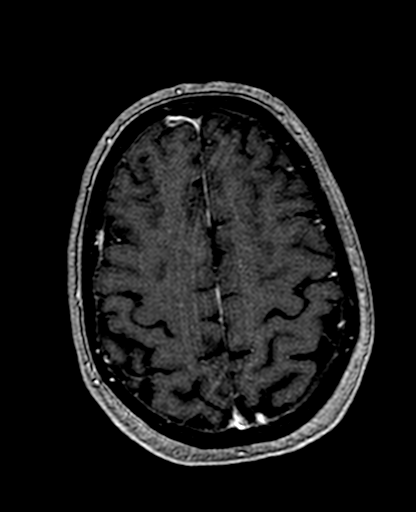
[im 123/144]
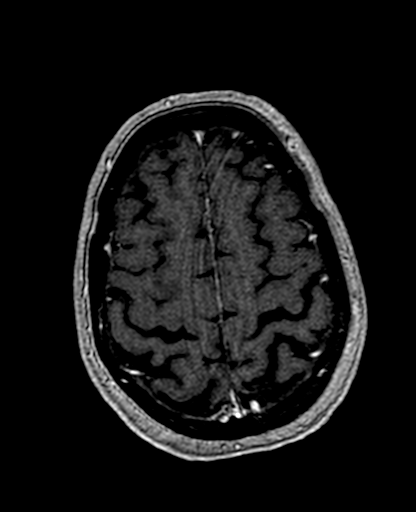
[im 137/144]
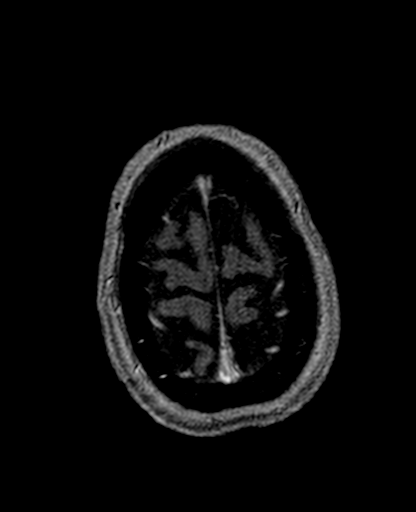

[29 of 48 positions shown; findings below may reference images not displayed]

FINDINGS: Nonspecific postsurgical changes of the subcutaneous fatty tissues
in the left supraorbital region. The appearance of this is
consistent with the recent biopsy. I cannot determine whether there
could be residual tumor in this region, as the findings are
consistent with postoperative change. Both globes appear normal.
Both optic nerves are normal. Extra-ocular muscles are normal.
Lacrimal glands are normal. Orbital fat is normal. Orbital apices
are normal. Cavernous sinus regions are normal. No evidence of
intra-axial brain lesion in the region studied. There is an
incidental meningioma of the right frontal lateral convexity
measuring 16 mm in diameter, slightly indenting the brain but
without significant mass effect or evidence of edema.
IMPRESSION: Postsurgical changes in the left supraorbital scalp region. The
appearance would be entirely consistent with benign postoperative
changes, but I cannot exclude the possibility of some residual or
second focus of lymphoma within the surgical region.

Orbits are otherwise normal.

Incidental 16 mm meningioma at the right frontal lateral convexity.

## 2019-02-03 ENCOUNTER — Ambulatory Visit: Payer: Medicare HMO | Admitting: Urology

## 2019-08-08 ENCOUNTER — Other Ambulatory Visit: Payer: Self-pay | Admitting: Oncology

## 2019-09-08 ENCOUNTER — Ambulatory Visit: Payer: Medicare HMO | Admitting: Radiation Oncology
